# Patient Record
Sex: Female | Born: 1935 | Race: White | Hispanic: Yes | Smoking: Never smoker
Health system: Southern US, Community
[De-identification: ages and names within clinical notes are randomized; demographics above are authoritative.]

## PROBLEM LIST (undated history)

## (undated) DIAGNOSIS — M8580 Other specified disorders of bone density and structure, unspecified site: Secondary | ICD-10-CM

## (undated) DIAGNOSIS — E079 Disorder of thyroid, unspecified: Secondary | ICD-10-CM

## (undated) DIAGNOSIS — L821 Other seborrheic keratosis: Secondary | ICD-10-CM

## (undated) HISTORY — DX: Other specified disorders of bone density and structure, unspecified site: M85.80

## (undated) HISTORY — DX: Disorder of thyroid, unspecified: E07.9

## (undated) HISTORY — DX: Other seborrheic keratosis: L82.1

## (undated) HISTORY — PX: APPENDECTOMY: SHX54

---

## 2005-04-13 ENCOUNTER — Emergency Department (HOSPITAL_COMMUNITY): Admission: EM | Admit: 2005-04-13 | Discharge: 2005-04-13 | Payer: Self-pay | Admitting: Family Medicine

## 2005-08-14 ENCOUNTER — Other Ambulatory Visit: Admission: RE | Admit: 2005-08-14 | Discharge: 2005-08-14 | Payer: Self-pay | Admitting: Gynecology

## 2005-10-11 ENCOUNTER — Encounter: Admission: RE | Admit: 2005-10-11 | Discharge: 2005-10-11 | Payer: Self-pay | Admitting: Gynecology

## 2006-01-10 ENCOUNTER — Emergency Department (HOSPITAL_COMMUNITY): Admission: EM | Admit: 2006-01-10 | Discharge: 2006-01-11 | Payer: Self-pay | Admitting: Emergency Medicine

## 2006-07-26 ENCOUNTER — Emergency Department (HOSPITAL_COMMUNITY): Admission: EM | Admit: 2006-07-26 | Discharge: 2006-07-26 | Payer: Self-pay | Admitting: Emergency Medicine

## 2006-12-11 ENCOUNTER — Ambulatory Visit (HOSPITAL_COMMUNITY): Admission: RE | Admit: 2006-12-11 | Discharge: 2006-12-12 | Payer: Self-pay | Admitting: Ophthalmology

## 2007-02-01 ENCOUNTER — Encounter: Admission: RE | Admit: 2007-02-01 | Discharge: 2007-02-01 | Payer: Self-pay | Admitting: Geriatric Medicine

## 2007-04-03 ENCOUNTER — Other Ambulatory Visit: Admission: RE | Admit: 2007-04-03 | Discharge: 2007-04-03 | Payer: Self-pay | Admitting: Gynecology

## 2007-09-10 ENCOUNTER — Encounter: Admission: RE | Admit: 2007-09-10 | Discharge: 2007-09-10 | Payer: Self-pay | Admitting: Geriatric Medicine

## 2008-04-03 ENCOUNTER — Other Ambulatory Visit: Admission: RE | Admit: 2008-04-03 | Discharge: 2008-04-03 | Payer: Self-pay | Admitting: Gynecology

## 2008-11-19 ENCOUNTER — Encounter: Admission: RE | Admit: 2008-11-19 | Discharge: 2008-11-19 | Payer: Self-pay | Admitting: Geriatric Medicine

## 2009-05-13 ENCOUNTER — Encounter: Admission: RE | Admit: 2009-05-13 | Discharge: 2009-05-13 | Payer: Self-pay | Admitting: Orthopedic Surgery

## 2009-11-30 ENCOUNTER — Other Ambulatory Visit: Admission: RE | Admit: 2009-11-30 | Discharge: 2009-11-30 | Payer: Self-pay | Admitting: Cardiology

## 2009-11-30 ENCOUNTER — Ambulatory Visit: Payer: Self-pay | Admitting: Gynecology

## 2010-01-27 ENCOUNTER — Encounter: Admission: RE | Admit: 2010-01-27 | Discharge: 2010-01-27 | Payer: Self-pay | Admitting: Geriatric Medicine

## 2010-10-02 ENCOUNTER — Encounter: Payer: Self-pay | Admitting: Geriatric Medicine

## 2010-10-03 ENCOUNTER — Encounter: Payer: Self-pay | Admitting: Geriatric Medicine

## 2010-12-15 ENCOUNTER — Encounter: Payer: Self-pay | Admitting: Gynecology

## 2011-01-16 ENCOUNTER — Other Ambulatory Visit: Payer: Self-pay | Admitting: Gynecology

## 2011-01-16 ENCOUNTER — Other Ambulatory Visit (HOSPITAL_COMMUNITY)
Admission: RE | Admit: 2011-01-16 | Discharge: 2011-01-16 | Disposition: A | Discharge status: Home / Self Care | Payer: Medicare Other | Source: Ambulatory Visit | Attending: Gynecology | Admitting: Gynecology

## 2011-01-16 ENCOUNTER — Encounter (INDEPENDENT_AMBULATORY_CARE_PROVIDER_SITE_OTHER): Payer: Medicare Other | Admitting: Gynecology

## 2011-01-16 DIAGNOSIS — I10 Essential (primary) hypertension: Secondary | ICD-10-CM

## 2011-01-16 DIAGNOSIS — R634 Abnormal weight loss: Secondary | ICD-10-CM

## 2011-01-16 DIAGNOSIS — E039 Hypothyroidism, unspecified: Secondary | ICD-10-CM

## 2011-01-16 DIAGNOSIS — Z124 Encounter for screening for malignant neoplasm of cervix: Secondary | ICD-10-CM

## 2011-01-16 DIAGNOSIS — Z1231 Encounter for screening mammogram for malignant neoplasm of breast: Secondary | ICD-10-CM

## 2011-01-16 DIAGNOSIS — M899 Disorder of bone, unspecified: Secondary | ICD-10-CM

## 2011-01-16 DIAGNOSIS — E559 Vitamin D deficiency, unspecified: Secondary | ICD-10-CM

## 2011-01-16 DIAGNOSIS — Z1211 Encounter for screening for malignant neoplasm of colon: Secondary | ICD-10-CM

## 2011-01-27 NOTE — Op Note (Signed)
NAME:  Kaylee Levine, Kaylee Levine           ACCOUNT NO.:  [id number]   MEDICAL RECORD NO.:  [id number]          PATIENT TYPE:  OIB   LOCATION:  2550                         FACILITY:  [facility]   PHYSICIAN:  John D. Ashley Royalty, M.D. DATE OF BIRTH:  [birth year]   DATE OF PROCEDURE:  12/11/2006  DATE OF DISCHARGE:                               OPERATIVE REPORT   ADMISSION DIAGNOSIS:  Rhegmatogenous retinal detachment, left eye.   PROCEDURE:  Scleral buckle left eye, retinal photocoagulation left eye,  gas fluid exchange left eye.   SURGEON:  Beulah Gandy. Ashley Royalty, M.D.   ASSISTANT:  Bryan Lemma. Lundquist, P.A.-C.   ANESTHESIA:  General.   DETAILS:  Usual prep and drape, 360 degrees limbal peritomy, isolation  of four rectus muscles on 2-0 silk.  Scleral dissection for 360 degrees  to admit a number 279 intrascleral implant.  Diathermy placed in the  bed.  The 240 band placed around the eye with a 270 sleeve at 11  o'clock.  Perforation site chosen at 2 o'clock. A large amount of clear  colorless subretinal fluid came forth.  The scleral flaps were closed  with 4-0 Mersilene suture as the fluid egressed.  C3F8 in a 50%  concentration, 0.9 mL, was injected through the pars plana into the  vitreous cavity to reinflate the globe.  A 508G radial segment was  placed at 1 o'clock beneath the break. The scleral flaps were close and  the band was adjusted.  Indirect ophthalmoscopy showed the retina to be  lying nicely on the scleral buckle with the break well supported. The  indirect ophthalmoscope laser was moved into place. 1065 burns were  placed around the periphery on the scleral buckle with a power of 1200  milliwatts, 1000 microns each, and 0.1 seconds each.  The buckle was  adjusted and trimmed.  The band was adjusted and trimmed.  The sutures  were knotted and the free ends removed.  The conjunctiva was reposited  with 7-0 chromic suture.  Polymyxin and gentamicin were irrigated into  tenon's space.   Atropine solution was applied.  Marcaine was injected  around the globe for postop pain.  Closing pressure was 10 with a  Banker.  Complications were none.  Duration two hours.  The  patient was awakened and taken to recovery in satisfactory condition.      Beulah Gandy. Ashley Royalty, M.D.  Electronically Signed     JDM/MEDQ  D:  12/11/2006  T:  12/11/2006  Job:  [id number]

## 2011-01-30 ENCOUNTER — Ambulatory Visit (HOSPITAL_COMMUNITY)
Admission: RE | Admit: 2011-01-30 | Discharge: 2011-01-30 | Disposition: A | Discharge status: Home / Self Care | Payer: Medicare Other | Source: Ambulatory Visit | Attending: Gynecology | Admitting: Gynecology

## 2011-01-30 DIAGNOSIS — Z1231 Encounter for screening mammogram for malignant neoplasm of breast: Secondary | ICD-10-CM | POA: Insufficient documentation

## 2011-02-28 ENCOUNTER — Institutional Professional Consult (permissible substitution) (INDEPENDENT_AMBULATORY_CARE_PROVIDER_SITE_OTHER): Payer: Medicare Other | Admitting: Gynecology

## 2011-02-28 ENCOUNTER — Other Ambulatory Visit: Payer: Self-pay | Admitting: Gynecology

## 2011-02-28 DIAGNOSIS — N9089 Other specified noninflammatory disorders of vulva and perineum: Secondary | ICD-10-CM

## 2011-05-18 ENCOUNTER — Ambulatory Visit (INDEPENDENT_AMBULATORY_CARE_PROVIDER_SITE_OTHER): Payer: Medicare Other | Admitting: Gynecology

## 2011-05-18 ENCOUNTER — Other Ambulatory Visit: Payer: Self-pay

## 2011-05-18 DIAGNOSIS — M899 Disorder of bone, unspecified: Secondary | ICD-10-CM

## 2011-05-18 DIAGNOSIS — M949 Disorder of cartilage, unspecified: Secondary | ICD-10-CM

## 2011-05-19 ENCOUNTER — Telehealth: Payer: Self-pay | Admitting: Anesthesiology

## 2011-05-19 ENCOUNTER — Other Ambulatory Visit: Payer: Self-pay | Admitting: *Deleted

## 2011-05-19 DIAGNOSIS — M898X9 Other specified disorders of bone, unspecified site: Secondary | ICD-10-CM

## 2011-05-19 NOTE — Telephone Encounter (Signed)
I called patient about her Dexa Report. Advised per Dr. Lily Peer that she needs to come in for Lab work ,, she needs Calcium, vitamin d, PTH, BUN, creatinine, and to make an appointment within a week to come in and discuss the results.. Order is in pc per [place]. All was explained in Spanish advise to call me back with any additional questions she may have.

## 2011-05-23 ENCOUNTER — Other Ambulatory Visit: Payer: Medicare Other

## 2011-05-23 ENCOUNTER — Other Ambulatory Visit: Payer: Self-pay | Admitting: Gynecology

## 2011-05-24 LAB — PTH, INTACT AND CALCIUM: Calcium, Total (PTH): 9.8 mg/dL (ref 8.4–10.5)

## 2011-05-24 LAB — BUN: BUN: 16 mg/dL (ref 6–23)

## 2011-05-25 LAB — CREATININE, SERUM: Creat: 0.93 mg/dL (ref 0.50–1.10)

## 2011-06-01 ENCOUNTER — Institutional Professional Consult (permissible substitution): Payer: Medicare Other | Admitting: Gynecology

## 2011-06-08 ENCOUNTER — Institutional Professional Consult (permissible substitution): Payer: Medicare Other | Admitting: Gynecology

## 2011-06-22 ENCOUNTER — Institutional Professional Consult (permissible substitution): Payer: Medicare Other | Admitting: Gynecology

## 2011-07-15 NOTE — Progress Notes (Deleted)
Kaylee Levine [birth year] [id number]   History:    77 y.o.  for annual exam ***  Past medical history,surgical history, family history and social history were all reviewed and documented in the EPIC chart.  ROS:  Was performed and pertinent positives and negatives are included in the history.  Exam: chaperone present There were no vitals taken for this visit.  There is no height or weight on file to calculate BMI.  General appearance : Well developed well nourished female. No acute distress HEENT: Neck supple, trachea midline, no carotid bruits, no thyroidmegaly Lungs: Clear to auscultation, no rhonchi or wheezes, or rib retractions  Heart: Regular rate and rhythm, no murmurs or gallops Breast:Examined in sitting and supine position were symmetrical in appearance, no palpable masses or tenderness,  no skin retraction, no nipple inversion, no nipple discharge, no skin discoloration, no axillary or supraclavicular lymphadenopathy Abdomen: no palpable masses or tenderness, no rebound or guarding Extremities: no edema or skin discoloration or tenderness  Pelvic:  Bartholin, Urethra, Skene Glands: Within normal limits             Vagina: No gross lesions or discharge  Cervix: No gross lesions or discharge  Uterus  ***, normal size, shape and consistency, non-tender and mobile  Adnexa  Without masses or tenderness  Anus and perineum  normal   Rectovaginal  normal sphincter tone without palpated masses or tenderness             Hemoccult ***     Assessment/Plan:  77 y.o. female for annual exam Reynaldo Minium H MD, 11:44 AM 07/15/2011

## 2011-07-17 ENCOUNTER — Encounter: Payer: Self-pay | Admitting: Gynecology

## 2011-07-17 ENCOUNTER — Ambulatory Visit (INDEPENDENT_AMBULATORY_CARE_PROVIDER_SITE_OTHER): Payer: Medicare Other | Admitting: Gynecology

## 2011-07-17 VITALS — BP 130/74

## 2011-07-17 DIAGNOSIS — M898X9 Other specified disorders of bone, unspecified site: Secondary | ICD-10-CM

## 2011-07-17 DIAGNOSIS — M899 Disorder of bone, unspecified: Secondary | ICD-10-CM

## 2011-07-17 DIAGNOSIS — M949 Disorder of cartilage, unspecified: Secondary | ICD-10-CM

## 2011-07-17 DIAGNOSIS — M8580 Other specified disorders of bone density and structure, unspecified site: Secondary | ICD-10-CM | POA: Insufficient documentation

## 2011-07-17 DIAGNOSIS — M948X9 Other specified disorders of cartilage, unspecified sites: Secondary | ICD-10-CM

## 2011-07-17 DIAGNOSIS — E7800 Pure hypercholesterolemia, unspecified: Secondary | ICD-10-CM | POA: Insufficient documentation

## 2011-07-17 DIAGNOSIS — D649 Anemia, unspecified: Secondary | ICD-10-CM | POA: Insufficient documentation

## 2011-07-17 DIAGNOSIS — E039 Hypothyroidism, unspecified: Secondary | ICD-10-CM | POA: Insufficient documentation

## 2011-07-17 DIAGNOSIS — I10 Essential (primary) hypertension: Secondary | ICD-10-CM | POA: Insufficient documentation

## 2011-07-17 MED ORDER — ALENDRONATE SODIUM 70 MG PO TABS: 70.0000 mg | ORAL_TABLET | ORAL | Status: DC

## 2011-07-17 NOTE — Progress Notes (Signed)
Patient 77 year old gravida 2 para 2 who was seen in the office in 01/16/2011. Patient with known history of osteopenia, hypercholesterolemia, hypothyroidism, hypertension, and anemia. Her primary physician is Dr. Redmond School. Patient during that visit had a fecal occult blood testing which was negative as well as her TSH and Pap smear. Her mammogram is up-to-date was made 2012 which was normal. Patient with long-standing history of osteopenia. She also has had a history of colonic polyp removed which was benign more than 7 years ago. She has been reminded to schedule an appointment with her gastroenterologist.  Her bone density study done here in the office on September 6 had demonstrated she had a significant bone loss of more than 8.1% on her left hip. For this reason the calcium, vitamin D, PT H., BUN and creatinine had been done which was normal. Since she is 77 years of age and are T score lowest at the right femoral neck is now -2.2 regardless of her Frax index been suppression all we discussed about placing her on antiresorptive agents will place her on generic alendronate 70 mg q. weekly and repeat her bone density study in one year. The risks benefits and pros and cons were discussed with the patient to include osteonecrosis of the jaw and spontaneous subtrochanteric fracture.  She'll make an appointment to followup with her internist as well as of the gastroenterologist and we'll see her back next year. All the above was discussed in Spanish and we'll follow accordingly.

## 2011-12-25 ENCOUNTER — Encounter (INDEPENDENT_AMBULATORY_CARE_PROVIDER_SITE_OTHER): Payer: Medicare Other | Admitting: Ophthalmology

## 2011-12-28 ENCOUNTER — Encounter (INDEPENDENT_AMBULATORY_CARE_PROVIDER_SITE_OTHER): Payer: Medicare Other | Admitting: Ophthalmology

## 2011-12-28 DIAGNOSIS — H33009 Unspecified retinal detachment with retinal break, unspecified eye: Secondary | ICD-10-CM

## 2011-12-28 DIAGNOSIS — H43819 Vitreous degeneration, unspecified eye: Secondary | ICD-10-CM

## 2011-12-28 DIAGNOSIS — H3530 Unspecified macular degeneration: Secondary | ICD-10-CM

## 2012-01-10 ENCOUNTER — Other Ambulatory Visit (HOSPITAL_BASED_OUTPATIENT_CLINIC_OR_DEPARTMENT_OTHER): Payer: Self-pay | Admitting: Geriatric Medicine

## 2012-01-10 DIAGNOSIS — Z1231 Encounter for screening mammogram for malignant neoplasm of breast: Secondary | ICD-10-CM

## 2012-01-17 ENCOUNTER — Encounter: Payer: Medicare Other | Admitting: Gynecology

## 2012-02-02 ENCOUNTER — Encounter: Payer: Self-pay | Admitting: Gynecology

## 2012-02-02 ENCOUNTER — Ambulatory Visit (INDEPENDENT_AMBULATORY_CARE_PROVIDER_SITE_OTHER): Payer: Medicare Other | Admitting: Gynecology

## 2012-02-02 VITALS — BP 140/84 | Ht 60.25 in | Wt 136.0 lb

## 2012-02-02 DIAGNOSIS — M8580 Other specified disorders of bone density and structure, unspecified site: Secondary | ICD-10-CM

## 2012-02-02 DIAGNOSIS — M949 Disorder of cartilage, unspecified: Secondary | ICD-10-CM

## 2012-02-02 DIAGNOSIS — E119 Type 2 diabetes mellitus without complications: Secondary | ICD-10-CM

## 2012-02-02 DIAGNOSIS — N952 Postmenopausal atrophic vaginitis: Secondary | ICD-10-CM

## 2012-02-02 NOTE — Patient Instructions (Signed)
Mantenimiento de la salud en las mujeres (Health Maintenance, Females) Un estilo de vida saludable y los cuidados preventivos pueden favorecer la salud y el bienestar.   Haga exmenes regulares de la salud en general, dentales y de los ojos.   Consuma una dieta saludable. Los alimentos como vegetales, frutas, granos enteros, productos lcteos descremados y protenas magras contienen los nutrientes que usted necesita sin necesidad de consumir muchas caloras. Disminuya el consumo de alimentos con alto contenido de grasas slidas, azcar y sal agregadas. Si es necesario, pdaleinformacin acerca de una dieta adecuada a su mdico.   La actividad fsica regular es una de las cosas ms importantes que puede hacer por su salud. Los adultos deben hacer al menos 150 minutos de ejercicios de intensidad moderada (cualquier actividad que aumente la frecuencia cardaca y lo haga transpirar) cada semana. Adems, la mayora de los adultos necesita ejercicios de fortalecimiento muscular 2  ms das por semana.    Mantenga un peso saludable. El ndice de masa corporal (IMC) es una herramienta que identifica posibles problemas con el peso. Proporciona una estimacin de la grasa corporal basndose en el peso y la altura. El mdico podr determinar su IMC y podr ayudarlo a lograr o mantener un peso saludable. Para los adultos de 20 aos o ms:   Un IMC menor a 18,5 se considera bajo peso.   Un IMC entre 18,5 y 24,9 es normal.   Un IMC entre 25 y 29,9 es sobrepeso.   Un IMC entre 30 o ms es obesidad.   Mantenga un nivel normal de lpidos y colesterol en sangre practicando actividad fsica y minimizando la ingesta de grasas saturadas. Consuma una dieta balanceada e incluya variedad de frutas y vegetales. Los anlisis de lpidos y colesterol en sangre deben comenzar a los 20 aos y repetirse cada 5 aos. Si los niveles de colesterol son altos, tiene ms de 50 aos o tiene riesgo elevado de sufrir enfermedades  cardacas, necesitar controlarse con ms frecuencia.Si tiene niveles elevados de lpidos y colesterol, debe recibir tratamiento con medicamentos, si la dieta y el ejercicio no son efectivos.   Si fuma, consulte con el profesional acerca de las opciones para dejar de hacerlo. Si no lo hace, no comience.   Si est embarazada no beba alcohol. Si est amamantando, beba alcohol con prudencia. Si elige beber alcohol, no se exceda de 1 medida por da. Se considera una medida a 12 onzas (355 ml) de cerveza, 5 onzas (148 ml) de vino, o 1,5 onzas (44 ml) de licor.   Evite el alcohol y el consumo de drogas. No comparta agujas. Pida ayuda si necesita asistencia o instrucciones con respecto a abandonar el consumo de alcohol, cigarrillos o drogas.   La hipertensin arterial causa enfermedades cardacas y aumenta el riesgo de ictus. Debe controlar su presin arterial al menos cada 1 o 2 aos. La presin arterial elevada que persiste debe tratarse con medicamentos si la prdida de peso y el ejercicio no son efectivos.   Si tiene entre 55 y 79 aos, consulte a su mdico si debe tomar aspirina para prevenir enfermedades cardacas.   Los anlisis para la diabetes incluyen la toma de una muestra de sangre para controlar el nivel de azcar en la sangre durante el ayuno. Debe hacerlo cada 3 aos despus de los 45 aos si est dentro de su peso normal y sin factores de riesgo para la diabetes. Las pruebas deben comenzar a edades tempranas o llevarse a cabo con ms frecuencia   si tiene sobrepeso y al menos 1 factor de riesgo para la diabetes.   Las evaluaciones para detectar el cncer de mama son un mtodo preventivo fundamental para las mujeres. Debe practicar la "autoconciencia de las mamas". Esto significa que debe reconocer la apariencia normal de sus mamas y como las siente y pudiendo incluir un autoexamen de mamas. Si detecta algn cambio, no importa cun pequeo sea, debe informarlo a su mdico. Las mujeres entre 20 y  40 aos deben hacer un examen clnico de las mamas como parte del examen regular de salud, cada 1 a 3 aos. Despus de los 40 aos deben hacerlo todos los aos. Deben hacerse una mamografa radografa de mamas ) cada ao, comenzando a los 40 aos. Las mujeres con historia familiar de cncer de mama deben hablar con el mdico para hacer un estudio gentico. Las que tienen ms riesgo deben hacerse resonancia magntica y una mamografa todos los aos.   Un test de Pap se realiza para diagnosticar cncer de cuello de tero. Las mujeres deben hacerse un test de Pap a partir de los 21 aos. Entre los 21 y los 29 aos debe repetirse cada dos aos. Luego de los 30 aos, debe realizarse un test de Pap cada tres aos siempre que los 3 estudios anteriores sean normales. Si le han realizado una histerectoma por un problema que no era cncer u otra enfermedad que podra causar cncer, ya no necesitar un test de Pap. Si tiene entre 65 y 70 aos y ha tenido un test de Pap normal en los ltimos 10 aos, ya no ser necesario realizarlo. Si ha recibido un tratamiento para el cncer cervical o para una enfermedad que podra causar cncer, necesitar realizar un test de Pap y controles durante al menos 20 aos de concluir el tratamiento. Si no se ha hecho el examen con regularidad, debern volver a evaluarse los factores de riesgo (como el tener un nuevo compaero sexual) para determinar si debe volver a realizarse los estudios. Algunas mujeres sufren problemas mdicos que aumentan la probabilidad de contraer cncer cervical. En estos casos, el mdico podr indicar que se realice el test de Pap con ms frecuencia.   La prueba del virus del papiloma humano (VPH) es un anlisis adicional que puede usarse para detectar cncer de cuello de tero. Esta prueba busca la presencia del virus que causa los cambios en el cuello. Las clulas que se recolectan durante el test de Pap pueden usarse para el VPH. La prueba para el VPH puede  usarse para evaluar a mujeres de ms de 30 aos y debe usarse en mujeres de cualquier edad cuyos resultados del test de Pap no sean claros. Despus de los 30 aos, las mujeres deben hacerse el anlisis para el VPH con la misma frecuencia que el test de Pap.   El cncer colorectal puede detectarse y con frecuencia puede prevenirse. La mayor parte de los estudios de rutina comienzan a los 50 aos y continan hasta los 75 aos. Sin embargo, el mdico podr aconsejarle que lo haga antes, si tiene factores de riesgo para el cncer de colon. Una vez por ao, el profesional le dar un kit de prueba para hallar sangre oculta en la materia fecal. La utilizacin de un tubo con una pequea cmara en su extremo para examinar directamente el colon (sigmoidoscopa o colonoscopa), puede detectar formas temprana de cncer colorectal. Hable con su mdico si tiene 50 aos, cuando comience con los estudios de rutina. El examen directo del   colon debe repetirse cada 5 a 10 aos, hasta los 75 aos, excepto que se encuentren formas tempranas de plipos precancerosos o pequeos bultos.   Se recomienda realizar un anlisis de sangre para detectar hepatitis C a todas las personas nacidas entre 1945 y 1965, y a todo aquel que tenga un riesgo conocido de haber contrado esta enfermedad.   Practique el sexo seguro. Use condones y evite las prcticas sexuales riesgosas para disminuir el contagio de enfermedades de transmisin sexual. Las mujeres sexualmente activas de 25 aos o menos deben controlarse para descartar clamidia, que es una infeccin de transmisin sexual frecuente. Las mujeres mayores que tengan mltiples compaeros tambin deben hacerse el anlisis para detectar clamidia. Se recomienda realizar anlisis para detectar otras enfermedades de transmisin sexual si es sexualmente activa y tiene riesgos.   La osteoporosis es una enfermedad en la que los huesos pierden los minerales y la fuerza por el avance de la edad. El  resultado pueden ser fracturas graves en los huesos. El riesgo de osteoporosis puede identificarse con una prueba de densidad sea. Las mujeres de ms de 65 aos y las que tengan riesgos de sufrir fracturas u osteoporosis deben pedir consejo a su mdico. Consulte a su mdico si debe tomar un suplemento de calcio o de vitamina D para reducir el riesgo de osteoporosis.   La menopausia se asocia a sntomas y riesgos fsicos. Se dispone de una terapia de reemplazo hormonal para disminuir los sntomas y los riesgos. Consulte a su mdico para saber si la terapia de reemplazo hormonal es conveniente para usted.   Use una pantalla solar con un factor SPF de 30 o mayor. Aplique pantalla de manera libre y repetida a lo largo del da. Pngase al resguardo del sol cuando la sombra sea ms pequea que usted. Protjase usando mangas y pantalones largos, un sombrero de ala ancha y gafas para el sol todo el ao, siempre que se encuentre en el exterior.   Informe a su mdico si aparecen nuevos lunares o los que tiene se modifican, especialmente en forma y color. Tambin notifique al mdico si un lunar es ms grande que el tamao de una goma de lpiz.   Mantngase al da con las vacunas.  Document Released: 08/17/2011 [employer] Patient Information 2012 [employer]. 

## 2012-02-02 NOTE — Progress Notes (Signed)
Kaylee Levine Jan 30, 1934 [id number]   History:    78 y.o.  with history of osteopenia tenia based on bone density study done in 2012. Patient's Frax analysis indicating that her 10 year fracture risk exceeded but cutoff threshold. She was started on Fosamax 70 mg q. weekly. She is tolerating it well. She is taking her calcium and vitamin D twice a day as well. Patient's colonoscopy was 8 years ago. Her last mammogram was in 2012 and she has one scheduled for a few weeks from now. Patient has a history of colon type 2 diabetes, hypertension, hypercholesterolemia, and hypothyroidism. She has not seen her primary physician and close to a year and would like to switch. She's not fasting today so no lab work will be drawn today.  Past medical history,surgical history, family history and social history were all reviewed and documented in the EPIC chart.  Gynecologic History No LMP recorded. Patient is postmenopausal. Contraception: none Last Pap: 2012. Results were: normal Last mammogram: 2012. Results were: normal  Obstetric History OB History    Grav Para Term Preterm Abortions TAB SAB Ect Mult Living   2 2 2       2      # Outc Date GA Lbr Len/2nd Wgt Sex Del Anes PTL Lv   1 TRM     M SVD  No Yes   2 TRM     F CS  No Yes       ROS: A ROS was performed and pertinent positives and negatives are included in the history.  GENERAL: No fevers or chills. HEENT: No change in vision, no earache, sore throat or sinus congestion. NECK: No pain or stiffness. CARDIOVASCULAR: No chest pain or pressure. No palpitations. PULMONARY: No shortness of breath, cough or wheeze. GASTROINTESTINAL: No abdominal pain, nausea, vomiting or diarrhea, melena or bright red blood per rectum. GENITOURINARY: No urinary frequency, urgency, hesitancy or dysuria. MUSCULOSKELETAL: Patient has joint pains in the mornings especially her knees, no back pain, no recent trauma. DERMATOLOGIC: No rash, no itching, no lesions.  ENDOCRINE: No polyuria, polydipsia, no heat or cold intolerance. No recent change in weight. HEMATOLOGICAL: No anemia or easy bruising or bleeding. NEUROLOGIC: No headache, seizures, numbness, tingling or weakness. PSYCHIATRIC: No depression, no loss of interest in normal activity or change in sleep pattern.     Exam: chaperone present  BP 140/84  Ht 5' 0.25" (1.53 m)  Wt 136 lb (61.689 kg)  BMI 26.34 kg/m2  Body mass index is 26.34 kg/(m^2).  General appearance : Well developed well nourished female. No acute distress HEENT: Neck supple, trachea midline, no carotid bruits, no thyroidmegaly Lungs: Clear to auscultation, no rhonchi or wheezes, or rib retractions  Heart: Regular rate and rhythm, no murmurs or gallops Breast:Examined in sitting and supine position were symmetrical in appearance, no palpable masses or tenderness,  no skin retraction, no nipple inversion, no nipple discharge, no skin discoloration, no axillary or supraclavicular lymphadenopathy Abdomen: no palpable masses or tenderness, no rebound or guarding Extremities: no edema or skin discoloration or tenderness  Pelvic:  Bartholin, Urethra, Skene Glands: Within normal limits             Vagina: No gross lesions or discharge  Cervix: No gross lesions or discharge  Uterus  axial, normal size, shape and consistency, non-tender and mobile  Adnexa  Without masses or tenderness  Anus and perineum  normal   Rectovaginal  normal sphincter tone without palpated masses or tenderness  Hemoccult cards provided     Assessment/Plan:  78 y.o. female for annual exam will be referred to one of my internal medicine colleagues to monitor her hypertension, hypothyroidism, hypercholesterolemia and type 2 diabetes. She will need a bone density study here in the office next year. New screening guidelines for Pap smear discussed she will no longer be a Pap smear. She has had normal Pap smears in the past. She was reminded again  to followup with her gastroenterologist. She is to send to the office the Hemoccult cards for testing. She was also reminded to followup with her mammogram and to continue to do her monthly self breast examination. She is to continue to take her calcium and vitamin D daily and her Fosamax weekly. We will take a drug-free holiday after 5-6 years of being on the alendronate. The alendronate (Fosamax) was started in 2012.    Ok Edwards MD, 11:24 AM 02/02/2012

## 2012-02-14 ENCOUNTER — Inpatient Hospital Stay (HOSPITAL_BASED_OUTPATIENT_CLINIC_OR_DEPARTMENT_OTHER): Admission: RE | Admit: 2012-02-14 | Payer: Medicare Other | Source: Ambulatory Visit

## 2012-02-19 ENCOUNTER — Other Ambulatory Visit: Payer: Self-pay | Admitting: *Deleted

## 2012-02-19 DIAGNOSIS — Z1211 Encounter for screening for malignant neoplasm of colon: Secondary | ICD-10-CM

## 2012-02-20 ENCOUNTER — Other Ambulatory Visit: Payer: Self-pay | Admitting: Gynecology

## 2012-02-20 DIAGNOSIS — Z1211 Encounter for screening for malignant neoplasm of colon: Secondary | ICD-10-CM

## 2012-02-21 ENCOUNTER — Other Ambulatory Visit: Payer: Self-pay | Admitting: Gynecology

## 2012-02-21 DIAGNOSIS — Z1231 Encounter for screening mammogram for malignant neoplasm of breast: Secondary | ICD-10-CM

## 2012-02-29 ENCOUNTER — Ambulatory Visit (HOSPITAL_BASED_OUTPATIENT_CLINIC_OR_DEPARTMENT_OTHER): Payer: Medicare Other

## 2012-03-08 ENCOUNTER — Ambulatory Visit (INDEPENDENT_AMBULATORY_CARE_PROVIDER_SITE_OTHER): Payer: Medicare Other | Admitting: Internal Medicine

## 2012-03-08 ENCOUNTER — Encounter: Payer: Self-pay | Admitting: Internal Medicine

## 2012-03-08 VITALS — BP 116/68 | HR 72 | Temp 98.4°F | Resp 14 | Ht 61.5 in | Wt 136.0 lb

## 2012-03-08 DIAGNOSIS — E039 Hypothyroidism, unspecified: Secondary | ICD-10-CM

## 2012-03-08 DIAGNOSIS — E7800 Pure hypercholesterolemia, unspecified: Secondary | ICD-10-CM

## 2012-03-08 DIAGNOSIS — I10 Essential (primary) hypertension: Secondary | ICD-10-CM

## 2012-03-08 DIAGNOSIS — E119 Type 2 diabetes mellitus without complications: Secondary | ICD-10-CM

## 2012-03-08 DIAGNOSIS — M1990 Unspecified osteoarthritis, unspecified site: Secondary | ICD-10-CM | POA: Insufficient documentation

## 2012-03-08 LAB — HEMOGLOBIN A1C: Hgb A1c MFr Bld: 6.1 % — ABNORMAL HIGH (ref <5.7)

## 2012-03-08 MED ORDER — HYDROCODONE-ACETAMINOPHEN 5-500 MG PO TABS: 1.0000 | ORAL_TABLET | Freq: Two times a day (BID) | ORAL | Status: AC | PRN

## 2012-03-08 NOTE — Assessment & Plan Note (Signed)
Reports a long history of DJD, has back pain on and off with sometimes radiation to the right leg. In the past she was told that she may need back surgery. She takes Vicodin as needed. Refill Vicodin today.

## 2012-03-08 NOTE — Assessment & Plan Note (Signed)
Is well-controlled, will check a BMP

## 2012-03-08 NOTE — Patient Instructions (Addendum)
Ask patient to   sign a release of information,  fax it to her previous primary doctor Dr.Tripp  in [place]. Request old records -------- Next visit in 3 months ------- FIRME EL DOCUMENTO PARA QUE PODAMOS OBTENER LOS RECORDS DEL DR. Redmond School REGRESE EN 3 MESES

## 2012-03-08 NOTE — Assessment & Plan Note (Signed)
On simvastatin, she does have pain but in the past it has been attributed to DJD

## 2012-03-08 NOTE — Assessment & Plan Note (Signed)
Reports good medication compliance, we'll check a TSH

## 2012-03-08 NOTE — Assessment & Plan Note (Signed)
Personal history of diabetes, on metformin, will check a hemoglobin A1c.

## 2012-03-08 NOTE — Progress Notes (Signed)
  Subjective:    Patient ID: Kaylee Levine, female    DOB: [birth year], 78 y.o.   MRN: [id number]  HPI New patient, transferring from r. Tripp in [place] Long history of diabetes, hypertension, hypothyroidism. Good medication compliance. Also has a history of DJD, takes Vicodin usually one time a day,  rarely requires 2 times a day. Ambulatory blood sugars around  90 to 113 No ambulatory blood pressures  Past medical history Diabetes Hypertension Hyperlipidemia, Thyroid disease DJD Osteoporosis, per gyn Gyn-- Dr Lily Peer  Past surgical history Appendectomy Bladder surgery  Social history Born in [place] to [place] in the 60s, moved to [place] ~ 2010 2 children, 1 son still in [place] lost a daughter Lives by herself, 2 Gkids live in [place] Tobacco-- never ETOH-- no   Family history Diabetes-- M CAD-- M Stroke-- no Colon cancer-- no Breast cancer-- no   Review of Systems No chest pain or shortness of breath No nausea, vomiting, diarrhea. No lower extremity edema.     Objective:   Physical Exam  General -- alert, well-developed, and well-nourished.   Neck --no thyromegaly , normal carotid pulse Lungs -- normal respiratory effort, no intercostal retractions, no accessory muscle use, and normal breath sounds.   Heart-- normal rate, regular rhythm, no murmur, and no gallop.   Extremities-- trace pretibial edema bilaterally Neurologic-- alert & oriented X3 and strength normal in all extremities. Psych-- Cognition and judgment appear intact. Alert and cooperative with normal attention span and concentration.  not anxious appearing and not depressed appearing.        Assessment & Plan:  New patient, used to see Dr. Redmond School, in [place]. Phone number [phone] Will request all records

## 2012-03-09 LAB — LIPID PANEL
Cholesterol: 182 mg/dL (ref 0–200)
Triglycerides: 104 mg/dL (ref <150)
VLDL: 21 mg/dL (ref 0–40)

## 2012-03-09 LAB — BASIC METABOLIC PANEL
BUN: 14 mg/dL (ref 6–23)
CO2: 27 mEq/L (ref 19–32)
Calcium: 9.1 mg/dL (ref 8.4–10.5)
Creat: 0.96 mg/dL (ref 0.50–1.10)
Glucose, Bld: 80 mg/dL (ref 70–99)

## 2012-03-09 LAB — AST: AST: 14 U/L (ref 0–37)

## 2012-03-13 ENCOUNTER — Encounter: Payer: Self-pay | Admitting: *Deleted

## 2012-03-15 ENCOUNTER — Ambulatory Visit (HOSPITAL_COMMUNITY): Payer: Medicare Other | Attending: Gynecology

## 2012-04-11 ENCOUNTER — Ambulatory Visit (HOSPITAL_COMMUNITY)
Admission: RE | Admit: 2012-04-11 | Discharge: 2012-04-11 | Disposition: A | Discharge status: Home / Self Care | Payer: Medicare Other | Source: Ambulatory Visit | Attending: Gynecology | Admitting: Gynecology

## 2012-04-11 DIAGNOSIS — Z1231 Encounter for screening mammogram for malignant neoplasm of breast: Secondary | ICD-10-CM | POA: Insufficient documentation

## 2012-04-16 ENCOUNTER — Telehealth: Payer: Self-pay | Admitting: Internal Medicine

## 2012-04-16 MED ORDER — SIMVASTATIN 40 MG PO TABS: 40.0000 mg | ORAL_TABLET | Freq: Every day | ORAL | Status: DC

## 2012-04-16 NOTE — Telephone Encounter (Signed)
Refill: Simvastatin 40 mg tablet. Take 1 tablet by mouth at bedtime. Qty 30. Last fill 03-11-12

## 2012-04-16 NOTE — Telephone Encounter (Signed)
Refill done.  

## 2012-04-17 ENCOUNTER — Other Ambulatory Visit: Payer: Self-pay | Admitting: *Deleted

## 2012-04-17 DIAGNOSIS — N63 Unspecified lump in unspecified breast: Secondary | ICD-10-CM

## 2012-04-22 ENCOUNTER — Telehealth: Payer: Self-pay | Admitting: *Deleted

## 2012-04-22 NOTE — Telephone Encounter (Signed)
Patient scheduled for follow up imaging for 04/25/12 at [facility] [place].

## 2012-04-22 NOTE — Telephone Encounter (Signed)
Message copied by Libby Maw on Mon Apr 22, 2012  8:55 AM ------      Message from: Ok Edwards      Created: Sun Apr 14, 2012 10:38 PM       Deano Tomaszewski, please check that this patients additional imaging studies of the breast have been ordered. Thanks JF

## 2012-04-25 ENCOUNTER — Ambulatory Visit
Admission: RE | Admit: 2012-04-25 | Discharge: 2012-04-25 | Disposition: A | Discharge status: Home / Self Care | Payer: Medicare Other | Source: Ambulatory Visit | Attending: Gynecology | Admitting: Gynecology

## 2012-04-25 DIAGNOSIS — N63 Unspecified lump in unspecified breast: Secondary | ICD-10-CM

## 2012-04-25 IMAGING — MG MM DIGITAL DIAGNOSTIC LIMITED*L*
2 series · 2 of 2 positions shown · non-contrast
Comparison: [DATE], [DATE], [DATE]

CLINICAL DATA: The patient returns for evaluation of a possible
mass in the left breast noted on recent screening study dated
[DATE].

[REDACTED] MAMMOGRAM

[L CC]
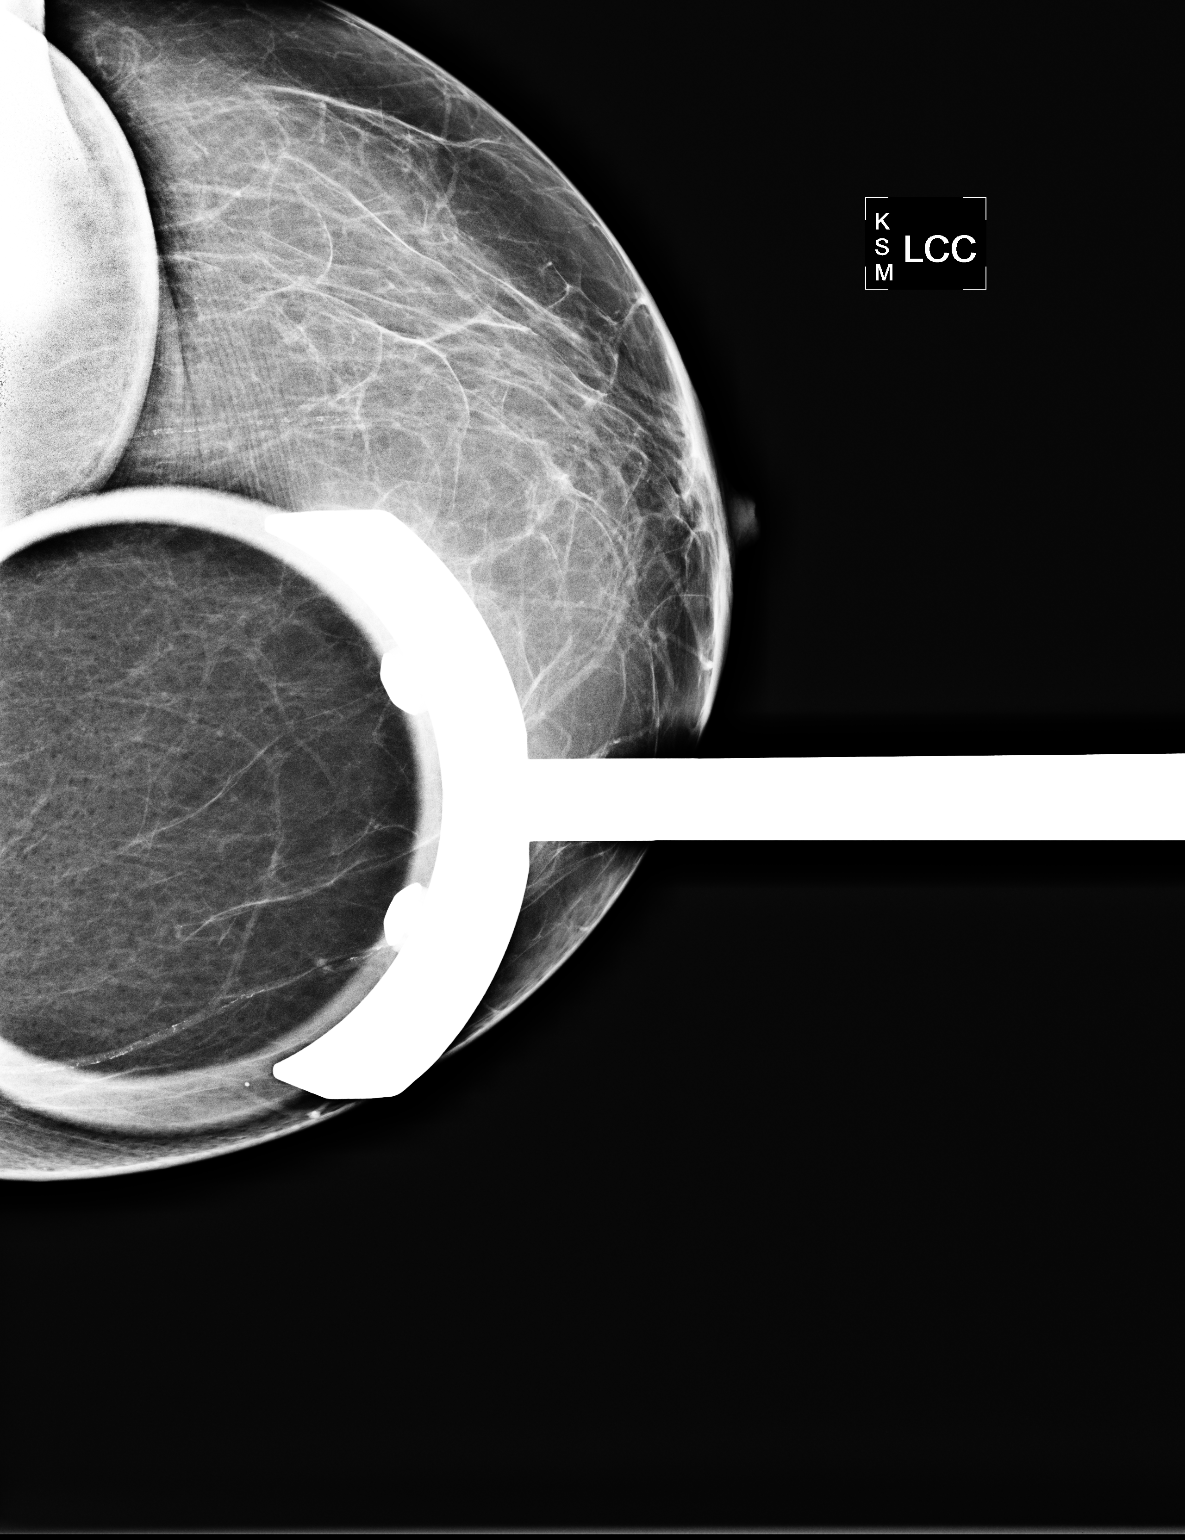

[L MLO]
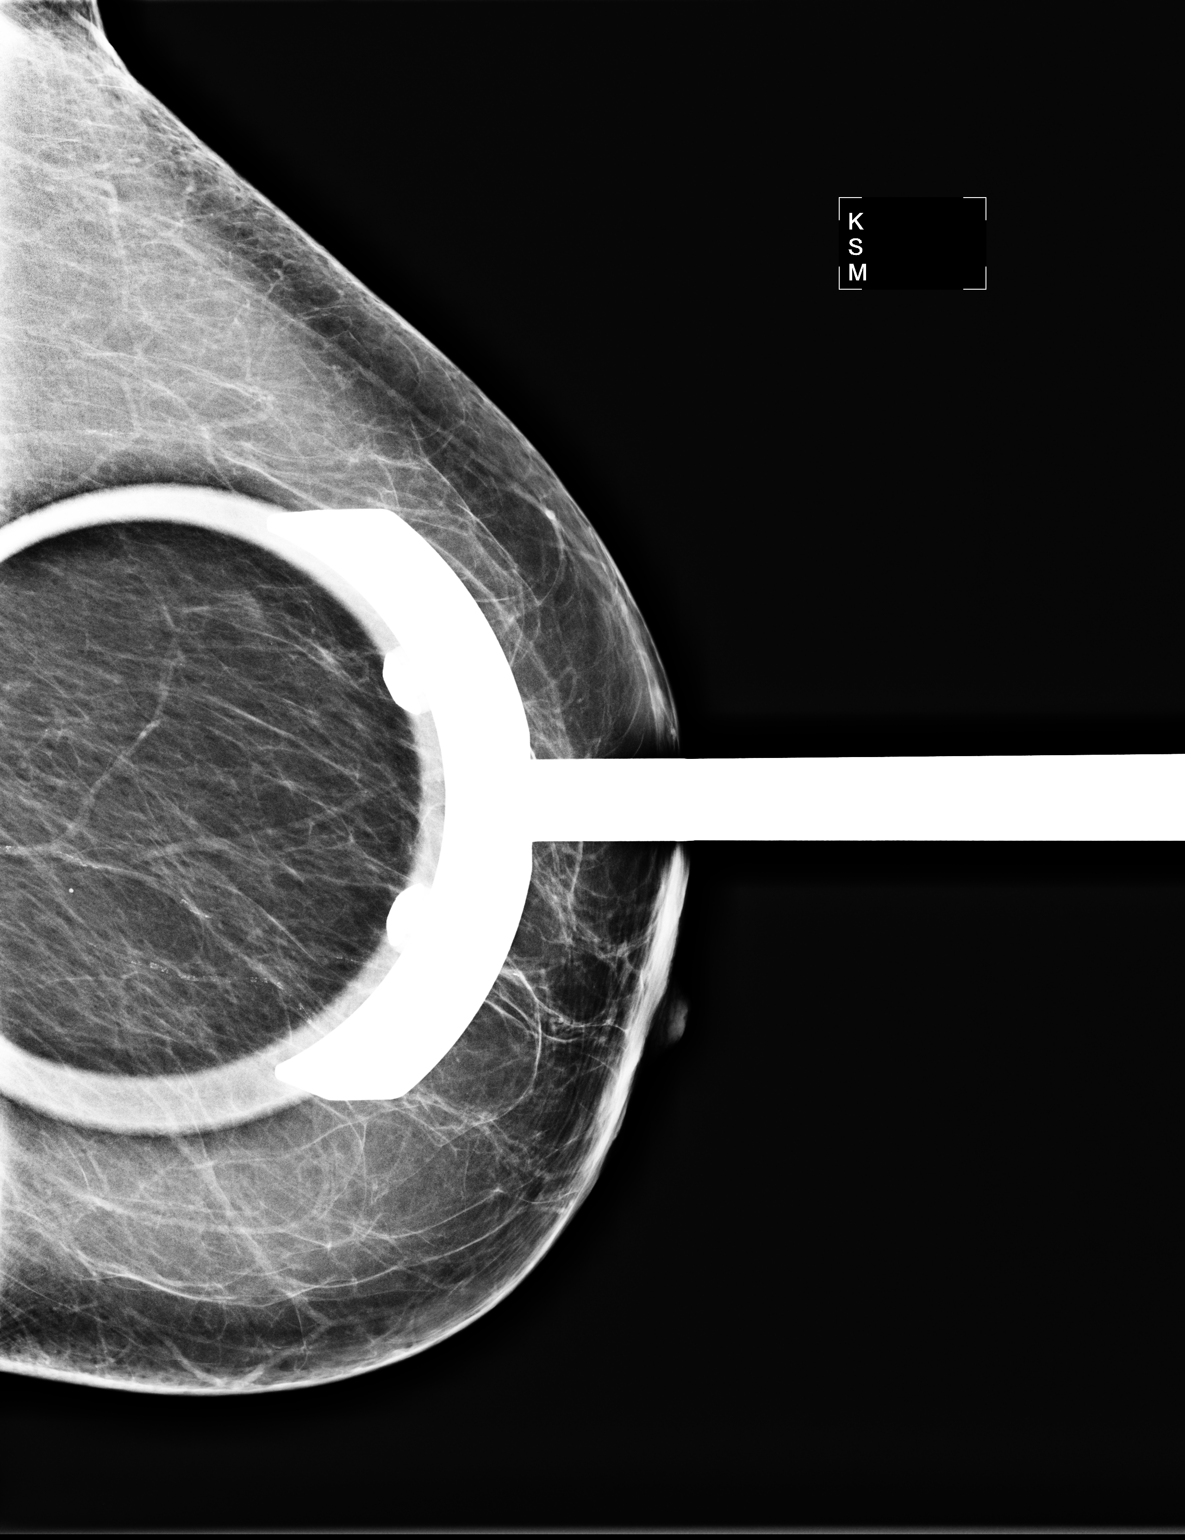

[2 of 2 positions shown; findings below may reference images not displayed]

FINDINGS: Additional views demonstrate no persistent mass or
distortion in the left upper inner quadrant.
IMPRESSION: No persistent worrisome abnormality upon additional imaging of the
left breast.

RECOMMENDATION:
Yearly screening mammography is suggested.

BI-RADS CATEGORY 1:  Negative.

## 2012-05-15 ENCOUNTER — Other Ambulatory Visit: Payer: Self-pay | Admitting: Internal Medicine

## 2012-05-15 NOTE — Telephone Encounter (Signed)
Ok to refill 

## 2012-05-15 NOTE — Telephone Encounter (Signed)
Done

## 2012-05-24 ENCOUNTER — Telehealth: Payer: Self-pay | Admitting: Internal Medicine

## 2012-05-24 MED ORDER — METFORMIN HCL 500 MG PO TABS: 500.0000 mg | ORAL_TABLET | Freq: Every day | ORAL | Status: DC

## 2012-05-24 NOTE — Telephone Encounter (Signed)
Pt needs new rx from Dr. Drue Novel on Metformin. She uses [address]

## 2012-05-24 NOTE — Telephone Encounter (Signed)
Refill done.  

## 2012-05-31 ENCOUNTER — Other Ambulatory Visit: Payer: Self-pay | Admitting: Internal Medicine

## 2012-05-31 NOTE — Telephone Encounter (Signed)
refill lancet, strips & Meter Per call from neighbor send to [facility] on [address] Pt speaks no english if you need to call her back may need to ask paz to help or use interpreter line  Cb# for patient [phone]

## 2012-06-03 MED ORDER — ONETOUCH ULTRA SYSTEM W/DEVICE KIT: 1.0000 | PACK | Freq: Once | Status: AC

## 2012-06-03 NOTE — Telephone Encounter (Signed)
Rx sent for one touch

## 2012-06-03 NOTE — Telephone Encounter (Signed)
Ok send RF to pharmacy

## 2012-06-07 MED ORDER — GLUCOSE BLOOD VI STRP: ORAL_STRIP | Status: AC

## 2012-06-07 NOTE — Telephone Encounter (Signed)
Caller (?) states the pts medication is not at the pharmacy. She needs her test strips. Please call test strips into [facility] on [address].

## 2012-06-07 NOTE — Telephone Encounter (Signed)
Sent in test strips to pharmacy 

## 2012-08-02 ENCOUNTER — Ambulatory Visit (INDEPENDENT_AMBULATORY_CARE_PROVIDER_SITE_OTHER): Payer: Medicare Other | Admitting: Internal Medicine

## 2012-08-02 VITALS — BP 144/74 | HR 74 | Temp 97.8°F | Wt 139.0 lb

## 2012-08-02 DIAGNOSIS — E119 Type 2 diabetes mellitus without complications: Secondary | ICD-10-CM

## 2012-08-02 DIAGNOSIS — M25519 Pain in unspecified shoulder: Secondary | ICD-10-CM

## 2012-08-02 MED ORDER — MELOXICAM 7.5 MG PO TABS: 7.5000 mg | ORAL_TABLET | Freq: Every day | ORAL | Status: DC

## 2012-08-02 NOTE — Progress Notes (Signed)
  Subjective:    Patient ID: Kaylee Levine, female    DOB: [birth year], 78 y.o.   MRN: [id number]  HPI Acute visit 2 weeks history of right shoulder pain, worse with arm elevation. She has chronic on and off neck pain, Vicodin usually helps the neck pain but has not help the shoulder pain.  Past medical history Diabetes Hypertension Hyperlipidemia, Thyroid disease DJD Osteoporosis, per gyn Gyn-- Dr Lily Peer  Past surgical history Appendectomy Bladder surgery  Social history Born in [place] to [place] in the 60s, moved to [place] ~ 2010 2 children, 1 son still in [place] lost a daughter Lives by herself, 2 Gkids live in [place]-- never ETOH-- no   Family history Diabetes-- M CAD-- M Stroke-- no Colon cancer-- no Breast cancer-- no   Review of Systems Denies any recent injury. Good compliance with medication Already had a flu shot Currently without neck pain    Objective:   Physical Exam  Musculoskeletal:       Arms:  General -- alert, well-developed, and well-nourished.   Neck --range of motion, not tender to palpation.  Extremities-- no pretibial edema bilaterally   Neurologic-- alert & oriented X3 , gait normal, strength normal in all extremities. Psych-- Cognition and judgment appear intact. Alert and cooperative with normal attention span and concentration.  not anxious appearing and not depressed appearing.       Assessment & Plan:  needs a routine checkup, patient aware

## 2012-08-02 NOTE — Assessment & Plan Note (Signed)
Needs a prescription for glucometer strips, done

## 2012-08-02 NOTE — Patient Instructions (Addendum)
Tome meloxicam 7.5 mg 1 pastilla al dia solo si le duele el hombro, tomela con Engineer, production. si le afecta el estomago (nausea, dolor de estomago, cambio en el color de las heces): deje de tomarla regrese en los proximos 2 meses para un chequeo

## 2012-08-02 NOTE — Assessment & Plan Note (Addendum)
Shoulder pain, not responding to Vicodin.  Although pain is going on for 2 weeks the range of motion is decreased, frozen shoulder? Plan: Mobic 7.5 daily for 2 weeks GI side effects discussed Refer to sports medicine ? Patient preferred to wait and see if she improves.

## 2012-08-14 ENCOUNTER — Other Ambulatory Visit: Payer: Self-pay | Admitting: Internal Medicine

## 2012-08-16 NOTE — Telephone Encounter (Signed)
Refill done.  

## 2012-08-16 NOTE — Telephone Encounter (Signed)
i do not see on pt's current med list. OK to refill?

## 2012-08-16 NOTE — Telephone Encounter (Signed)
She was supposed to be, okay to fill a three-month supply

## 2012-09-12 ENCOUNTER — Other Ambulatory Visit: Payer: Self-pay | Admitting: Internal Medicine

## 2012-09-12 NOTE — Telephone Encounter (Signed)
Spoke to nick at pharmacy & he stated pt picked this med up on 12.4.13. Pt does not need refill at this time.

## 2012-10-02 ENCOUNTER — Ambulatory Visit: Payer: Medicare Other | Admitting: Internal Medicine

## 2012-10-04 ENCOUNTER — Ambulatory Visit: Payer: Medicare Other | Admitting: Internal Medicine

## 2012-10-12 ENCOUNTER — Other Ambulatory Visit: Payer: Self-pay | Admitting: Internal Medicine

## 2012-10-14 NOTE — Telephone Encounter (Signed)
Refill done.  

## 2012-10-24 ENCOUNTER — Ambulatory Visit: Payer: Medicare Other | Admitting: Internal Medicine

## 2012-10-29 ENCOUNTER — Ambulatory Visit (INDEPENDENT_AMBULATORY_CARE_PROVIDER_SITE_OTHER): Payer: 59 | Admitting: Internal Medicine

## 2012-10-29 ENCOUNTER — Encounter: Payer: Self-pay | Admitting: Internal Medicine

## 2012-10-29 VITALS — BP 134/76 | HR 80 | Temp 97.9°F | Wt 139.0 lb

## 2012-10-29 DIAGNOSIS — D649 Anemia, unspecified: Secondary | ICD-10-CM

## 2012-10-29 DIAGNOSIS — M25519 Pain in unspecified shoulder: Secondary | ICD-10-CM

## 2012-10-29 DIAGNOSIS — I10 Essential (primary) hypertension: Secondary | ICD-10-CM

## 2012-10-29 DIAGNOSIS — M1990 Unspecified osteoarthritis, unspecified site: Secondary | ICD-10-CM

## 2012-10-29 DIAGNOSIS — E039 Hypothyroidism, unspecified: Secondary | ICD-10-CM

## 2012-10-29 DIAGNOSIS — E119 Type 2 diabetes mellitus without complications: Secondary | ICD-10-CM

## 2012-10-29 DIAGNOSIS — R110 Nausea: Secondary | ICD-10-CM

## 2012-10-29 MED ORDER — ALPRAZOLAM 0.5 MG PO TABS: ORAL_TABLET | ORAL | Status: DC

## 2012-10-29 NOTE — Assessment & Plan Note (Signed)
Good medication compliance, check a hemoglobin A1c.

## 2012-10-29 NOTE — Assessment & Plan Note (Addendum)
Was seen with shoulder pain, prescribe Mobic, self discontinue MOBIC because her almost daily nausea got  slightly worse. Today presents with knee pain, most likely a DJD flare. Plan: Tylenol, offered a orthopedic surgical referral.

## 2012-10-29 NOTE — Assessment & Plan Note (Signed)
See  DJD

## 2012-10-29 NOTE — Assessment & Plan Note (Addendum)
Several months history of nausea without vomiting. She reports a remote history of gastritis, had a EGD. Currently on PPIs, no heartburn. DDX includes a medication (on metformin, low dose), gastritis, gallbladder, gastroparesis, others. Plan: Asked patient to hold metformin for 2 weeks to see if the nausea improves. Patient to let  me know. GI referral, increase Prilosec dose Also she has a history of anemia, will check iron and ferritin

## 2012-10-29 NOTE — Patient Instructions (Addendum)
Takes Prilosec 2 tablets daily Stop taking metformin for 2 weeks, see if that improves your nausea. Please come back in 6 weeks For pain, take Tylenol 500 mg 2 tablets 3 times a day as needed. --- TOME PRILOSEC 2 TABLETS AL DIA ANTES DEL DESAYUNO DEJE LA METFORMINA POR 2 SEMANAS SOLAMENTE, ME AVISA SI LE MEJORA LA NAUSEA REGRESE EN 6 SEMANAS PARAA EL DOLOR, TOME TYLENOL 500 MG 2 TABLETAS CADA 8 HORAS SI LO NECESITA

## 2012-10-29 NOTE — Assessment & Plan Note (Addendum)
Good compliance of medication, last TSH normal

## 2012-10-29 NOTE — Progress Notes (Signed)
  Subjective:    Patient ID: Kaylee Levine, female    DOB: 12/15/1933, 79 y.o.   MRN: [id number]  HPI Routine office visit Diabetes, on metformin, ambulatory blood sugars range from 100 to 120. High cholesterol, good medication compliance. Hypertension good medication compliance, ambulatory BPs reportedly normal. Hypothyroidism, good medication compliance. Today the patient reports nausea, she has it most mornings for the last 6 months, last for a few hours, not worse by eating. She has been on metformin for long time. See review of systems. Also, 3 days history of right knee pain.  Past medical history  Diabetes  Hypertension  Hyperlipidemia,  Thyroid disease  DJD  Osteoporosis, per gyn  Gyn-- Dr Lily Peer  Past surgical history  Appendectomy  Bladder surgery  Social history  Born in [place] to [place] in the 60s, moved to [place] ~ 2010  2 children, 1 son still in [place] lost a daughter  Lives by herself, 2 Gkids live in [place]  Tobacco-- never  ETOH-- no  Family history  Diabetes-- M  CAD-- M  Stroke-- no  Colon cancer-- no  Breast cancer-- no  Review of Systems Denies any headaches or weight loss. No chest pain or shortness of breath No vomiting, diarrhea, blood in the stools. Takes PPIs, currently with no heartburn. Occasionally has a ill-defined abdominal pain, "gas". Denies dysphasia to solids or liquids.     Objective:   Physical Exam General -- alert, well-developed  Neck, no lymphadenopathies, no supraclavicular mass Lungs -- normal respiratory effort, no intercostal retractions, no accessory muscle use, and normal breath sounds.   Heart-- normal rate, regular rhythm, no murmur, and no gallop.   Abdomen--not distended, good bowel sounds, slightly tender in the epigastric area.  Extremities-- trace pretibial edema L>R, varicose veins noted, no phlebitis. Both knees have DJD type of changes, right knee slightly swollen, no warm, no  effusion. Psych-- Cognition and judgment appear intact. Alert and cooperative with normal attention span and concentration.  not anxious appearing and not depressed appearing.        Assessment & Plan:  Needs something for nerves during flying, see Rx , warned about drowsiness

## 2012-10-29 NOTE — Assessment & Plan Note (Signed)
Well-controlled, check a CMP 

## 2012-10-30 ENCOUNTER — Encounter: Payer: Self-pay | Admitting: Gastroenterology

## 2012-10-30 LAB — FERRITIN: Ferritin: 56.4 ng/mL (ref 10.0–291.0)

## 2012-10-30 LAB — CBC WITH DIFFERENTIAL/PLATELET
Basophils Absolute: 0 10*3/uL (ref 0.0–0.1)
Basophils Relative: 0.8 % (ref 0.0–3.0)
Eosinophils Absolute: 0.1 10*3/uL (ref 0.0–0.7)
Lymphocytes Relative: 34 % (ref 12.0–46.0)
MCHC: 33.2 g/dL (ref 30.0–36.0)
Monocytes Absolute: 0.5 10*3/uL (ref 0.1–1.0)
Neutrophils Relative %: 54.7 % (ref 43.0–77.0)
Platelets: 244 10*3/uL (ref 150.0–400.0)
RBC: 4.2 Mil/uL (ref 3.87–5.11)
WBC: 4.9 10*3/uL (ref 4.5–10.5)

## 2012-10-30 LAB — COMPREHENSIVE METABOLIC PANEL
ALT: 14 U/L (ref 0–35)
Albumin: 3.8 g/dL (ref 3.5–5.2)
BUN: 13 mg/dL (ref 6–23)
CO2: 27 mEq/L (ref 19–32)
Calcium: 9.5 mg/dL (ref 8.4–10.5)
Chloride: 105 mEq/L (ref 96–112)
Creatinine, Ser: 1 mg/dL (ref 0.4–1.2)
GFR: 59.27 mL/min — ABNORMAL LOW (ref >60.00)
Potassium: 4.1 mEq/L (ref 3.5–5.1)

## 2012-10-30 LAB — IRON: Iron: 53 ug/dL (ref 42–145)

## 2012-11-04 ENCOUNTER — Encounter: Payer: Self-pay | Admitting: *Deleted

## 2012-11-13 ENCOUNTER — Other Ambulatory Visit: Payer: Self-pay | Admitting: Internal Medicine

## 2012-11-13 NOTE — Telephone Encounter (Signed)
Refill done.  

## 2012-11-15 ENCOUNTER — Ambulatory Visit: Payer: Medicare Other | Admitting: Gastroenterology

## 2012-11-20 ENCOUNTER — Telehealth: Payer: Self-pay | Admitting: Internal Medicine

## 2012-11-20 DIAGNOSIS — E039 Hypothyroidism, unspecified: Secondary | ICD-10-CM

## 2012-11-20 MED ORDER — LEVOTHYROXINE SODIUM 25 MCG PO TABS: 25.0000 ug | ORAL_TABLET | Freq: Every day | ORAL | Status: DC

## 2012-11-20 NOTE — Telephone Encounter (Signed)
Last OV 10-29-12,no refill history, last TSH 03-08-12  2.305.Please advise

## 2012-11-20 NOTE — Telephone Encounter (Signed)
Patient's friend states the patient is out of levothyroxine and the pharmacy has been trying to contact us. I do not see any documentation in chart from [facility]. Pt requesting that we send this to [facility] on [address]

## 2012-11-21 NOTE — Telephone Encounter (Addendum)
We just send the Rx to [facility], send it again if needed

## 2012-12-06 ENCOUNTER — Encounter: Payer: Self-pay | Admitting: Gastroenterology

## 2012-12-06 ENCOUNTER — Ambulatory Visit (INDEPENDENT_AMBULATORY_CARE_PROVIDER_SITE_OTHER): Payer: Medicare Other | Admitting: Gastroenterology

## 2012-12-06 VITALS — BP 146/82 | HR 76 | Ht 61.0 in | Wt 139.0 lb

## 2012-12-06 DIAGNOSIS — R110 Nausea: Secondary | ICD-10-CM

## 2012-12-06 DIAGNOSIS — K5900 Constipation, unspecified: Secondary | ICD-10-CM

## 2012-12-06 MED ORDER — MOVIPREP 100 G PO SOLR: 1.0000 | Freq: Once | ORAL | Status: AC

## 2012-12-06 NOTE — Patient Instructions (Addendum)
Start miralax powder (one dose every day) for your constipation. You will be set up for a colonoscopy (for your constipation) You will be set up for an upper endoscopy (for your nausea, LEC; moderate sedation).                                               We are excited to introduce MyChart, a new best-in-class service that provides you online access to important information in your electronic medical record. We want to make it easier for you to view your health information - all in one secure location - when and where you need it. We expect MyChart will enhance the quality of care and service we provide.  When you register for MyChart, you can:    View your test results.    Request appointments and receive appointment reminders via email.    Request medication renewals.    View your medical history, allergies, medications and immunizations.    Communicate with your physician's office through a password-protected site.    Conveniently print information such as your medication lists.  To find out if MyChart is right for you, please talk to a member of our clinical staff today. We will gladly answer your questions about this free health and wellness tool.  If you are age 77 or older and want a member of your family to have access to your record, you must provide written consent by completing a proxy form available at our office. Please speak to our clinical staff about guidelines regarding accounts for patients younger than age 32.  As you activate your MyChart account and need any technical assistance, please call the MyChart technical support line at [phone]) or email your question to [email or web address]. If you email your question(s), please include your name, a return phone number and the best time to reach you.  If you have non-urgent health-related questions, you can send a message to our office through MyChart at [email or web address]. If you have a  medical emergency, call 911.  Thank you for using MyChart as your new health and wellness resource!   MyChart licensed from [employer],  [id number]. Patents Pending.

## 2012-12-06 NOTE — Progress Notes (Signed)
HPI: This is a  very pleasant Spanish speaking only woman, 79 years old. She is here with a friend who acts as a Nurse, learning disability.  Nausea, but no vomiting. Also gassy. Nausea for 3 months.  No abdominal pains.  Fills up fast, earlier.  Nausea intermittent.    Has not lost weight.  Only tylenol for routine pains.  No pyrosis.  Is pretty constipated normally.  Can go 5-6 days without a BM. This constipation is actually new for her 1 month ago.  No overt GI bleeding.  She tells me she had a colonoscopy in [place] 10-15 years ago. She does not recall exact details of that.   Review of systems: Pertinent positive and negative review of systems were noted in the above HPI section. Complete review of systems was performed and was otherwise normal.    Past Medical History  Diagnosis Date  . Osteopenia   . Vitamin D deficiency   . Diabetes mellitus   . Keratosis, seborrheic     mons pubis  . Thyroid disease     Past Surgical History  Procedure Laterality Date  . Appendectomy      Current Outpatient Prescriptions  Medication Sig Dispense Refill  . alendronate (FOSAMAX) 70 MG tablet take 1 tablet by mouth every week WITH A FULL GLASS OF WATER.  12 tablet  0  . ALPRAZolam (XANAX) 0.5 MG tablet 1/2 OR 1 TAB ONCE A DAY AS NEEDED FOR FLYING  12 tablet  0  . Blood Glucose Monitoring Suppl (ONE TOUCH ULTRA SYSTEM KIT) W/DEVICE KIT 1 kit by Does not apply route once.  1 each  0  . Calcium Carbonate-Vitamin D (CALCIUM + D PO) Take by mouth. Calcium 1000 mg + Vitamin D3 1000 IU      . ferrous sulfate 325 (65 FE) MG tablet Take 325 mg by mouth daily with breakfast.      . glucose blood (ONE TOUCH TEST STRIPS) test strip Use as instructed  100 each  12  . HYDROcodone-acetaminophen (VICODIN) 5-500 MG per tablet take 1 tablet by mouth twice a day if needed for pain  30 tablet  0  . levothyroxine (SYNTHROID, LEVOTHROID) 25 MCG tablet Take 1 tablet (25 mcg total) by mouth daily.  30 tablet  5  .  lisinopril-hydrochlorothiazide (PRINZIDE,ZESTORETIC) 10-12.5 MG per tablet take 1 tablet by mouth every morning  30 tablet  6  . metFORMIN (GLUCOPHAGE) 500 MG tablet Take 1 tablet (500 mg total) by mouth daily with breakfast.  30 tablet  5  . omeprazole (PRILOSEC) 20 MG capsule       . omeprazole (PRILOSEC) 20 MG capsule take 1 capsule by mouth once daily  30 capsule  6  . simvastatin (ZOCOR) 40 MG tablet Take 1 tablet (40 mg total) by mouth at bedtime.  30 tablet  5   No current facility-administered medications for this visit.    Allergies as of 12/06/2012  . (No Known Allergies)    Family History  Problem Relation Age of Onset  . Diabetes Mother   . Hypertension Mother   . Heart disease Mother     History   Social History  . Marital Status: Widowed    Spouse Name: N/A    Number of Children: 2  . Years of Education: N/A   Occupational History  . Disabled    Social History Main Topics  . Smoking status: Never Smoker   . Smokeless tobacco: Never Used  . Alcohol Use:  No  . Drug Use: No  . Sexually Active: Not on file   Other Topics Concern  . Not on file   Social History Narrative  . No narrative on file       Physical Exam: BP 146/82  Pulse 76  Ht 5\' 1"  (1.549 m)  Wt 139 lb (63.05 kg)  BMI 26.28 kg/m2 Constitutional: generally well-appearing Psychiatric: alert and oriented x3 Eyes: extraocular movements intact Mouth: oral pharynx moist, no lesions Neck: supple no lymphadenopathy Cardiovascular: heart regular rate and rhythm Lungs: clear to auscultation bilaterally Abdomen: soft, nontender, nondistended, no obvious ascites, no peritoneal signs, normal bowel sounds Extremities: no lower extremity edema bilaterally Skin: no lesions on visible extremities    Assessment and plan: 79 y.o. female with  nausea, constipation  She is going to start taking MiraLax one dose daily for her chronic constipation. Perhaps this is also adding to her nausea. I would  like to proceed with colonoscopy as well as upper endoscopy for the above complaints. She has not had colonoscopy and at least 10-15 years and I do think she's ever had upper endoscopy.

## 2012-12-10 ENCOUNTER — Ambulatory Visit: Payer: Medicare Other | Admitting: Internal Medicine

## 2012-12-14 ENCOUNTER — Telehealth: Payer: Self-pay | Admitting: Internal Medicine

## 2012-12-16 NOTE — Telephone Encounter (Signed)
Ok to refill? Last OV 2.18.14 Last filled 2.18.14

## 2012-12-16 NOTE — Telephone Encounter (Signed)
done

## 2012-12-20 ENCOUNTER — Ambulatory Visit: Payer: Medicare Other | Admitting: Internal Medicine

## 2012-12-24 LAB — HM DIABETES EYE EXAM

## 2013-01-01 ENCOUNTER — Ambulatory Visit (INDEPENDENT_AMBULATORY_CARE_PROVIDER_SITE_OTHER): Payer: Medicare Other | Admitting: Ophthalmology

## 2013-01-07 ENCOUNTER — Encounter: Payer: Medicare Other | Admitting: Gastroenterology

## 2013-01-10 ENCOUNTER — Encounter: Payer: Self-pay | Admitting: *Deleted

## 2013-02-10 ENCOUNTER — Other Ambulatory Visit: Payer: Self-pay | Admitting: Internal Medicine

## 2013-02-10 NOTE — Telephone Encounter (Signed)
Refill done.  

## 2013-02-26 ENCOUNTER — Ambulatory Visit (INDEPENDENT_AMBULATORY_CARE_PROVIDER_SITE_OTHER): Payer: Medicare Other | Admitting: Ophthalmology

## 2013-02-28 ENCOUNTER — Ambulatory Visit: Payer: Medicare Other | Admitting: Internal Medicine

## 2013-03-12 ENCOUNTER — Telehealth: Payer: Self-pay | Admitting: Internal Medicine

## 2013-03-12 NOTE — Telephone Encounter (Signed)
Pt only speaks spanish.  Faxed back rx request asking pharmacy to re-send by escribe.

## 2013-03-12 NOTE — Telephone Encounter (Signed)
REFILL:C/S ACCU-CHEK AVIVA SOL AUTOLET IMPRESSION DEVICE LANCET ULTILET

## 2013-03-12 NOTE — Telephone Encounter (Signed)
Refill: Accu-chek aviva plus Battery energizer [phone] T/s accu-chk aviva plus

## 2013-03-17 ENCOUNTER — Other Ambulatory Visit: Payer: Self-pay | Admitting: Gynecology

## 2013-03-25 ENCOUNTER — Encounter: Payer: Self-pay | Admitting: Internal Medicine

## 2013-03-25 ENCOUNTER — Ambulatory Visit (INDEPENDENT_AMBULATORY_CARE_PROVIDER_SITE_OTHER): Payer: 59 | Admitting: Internal Medicine

## 2013-03-25 ENCOUNTER — Telehealth: Payer: Self-pay | Admitting: *Deleted

## 2013-03-25 VITALS — BP 170/80 | HR 83 | Temp 98.1°F | Wt 134.6 lb

## 2013-03-25 DIAGNOSIS — R110 Nausea: Secondary | ICD-10-CM

## 2013-03-25 DIAGNOSIS — E7800 Pure hypercholesterolemia, unspecified: Secondary | ICD-10-CM

## 2013-03-25 DIAGNOSIS — E039 Hypothyroidism, unspecified: Secondary | ICD-10-CM

## 2013-03-25 DIAGNOSIS — F411 Generalized anxiety disorder: Secondary | ICD-10-CM

## 2013-03-25 DIAGNOSIS — E119 Type 2 diabetes mellitus without complications: Secondary | ICD-10-CM

## 2013-03-25 DIAGNOSIS — I10 Essential (primary) hypertension: Secondary | ICD-10-CM

## 2013-03-25 MED ORDER — LEVOTHYROXINE SODIUM 25 MCG PO TABS: 25.0000 ug | ORAL_TABLET | Freq: Every day | ORAL | Status: DC

## 2013-03-25 MED ORDER — ALENDRONATE SODIUM 70 MG PO TABS: ORAL_TABLET | ORAL | Status: AC

## 2013-03-25 MED ORDER — SIMVASTATIN 40 MG PO TABS: 40.0000 mg | ORAL_TABLET | Freq: Every day | ORAL | Status: AC

## 2013-03-25 MED ORDER — ALPRAZOLAM 0.5 MG PO TABS: 0.2500 mg | ORAL_TABLET | Freq: Every evening | ORAL | Status: AC | PRN

## 2013-03-25 MED ORDER — OMEPRAZOLE 20 MG PO CPDR: DELAYED_RELEASE_CAPSULE | ORAL | Status: DC

## 2013-03-25 MED ORDER — LISINOPRIL-HYDROCHLOROTHIAZIDE 10-12.5 MG PO TABS: ORAL_TABLET | ORAL | Status: AC

## 2013-03-25 MED ORDER — METFORMIN HCL 500 MG PO TABS: ORAL_TABLET | ORAL | Status: AC

## 2013-03-25 NOTE — Progress Notes (Signed)
  Subjective:    Patient ID: Kaylee Levine, female    DOB: 1934-04-11, 79 y.o.   MRN: [id number]  HPI Here to discuss the following issues Complained of dizziness for several days, described as a spinning, initially had the symptoms when she turned in bed, now better but he still has dizziness in the morning and gradually decreased throughout the day, worse w/ head motion? Pt not sure. Occasionally has nausea. Denies diplopia, motor deficits, slurred speech, facial paralysis. Diabetes, good compliance with medications, CBG today 117. H/o nausea, saw GI, they recommended EGD and a colonoscopy which are pending. Nausea has actually subsided.   Past medical history   Diabetes   Hypertension   Hyperlipidemia,   Thyroid disease   DJD   Osteoporosis, per gyn   Gyn-- Dr Lily Peer   Past surgical history   Appendectomy   Bladder surgery   Social history   Born in [place] to [place] in the 60s, moved to [place] ~ 2010   2 children, 1 son still in [place] lost a daughter   Lives by herself, 2 Gkids live in [place]   Tobacco-- never   ETOH-- no   Family history   Diabetes-- M   CAD-- M   Stroke-- no   Colon cancer-- no   Breast cancer-- no   Review of Systems No chest pain or shortness or breath No vomiting, diarrhea or blood in the stools. Also, reports a long history of anxiety and some insomnia. Denies depression. At some point I prescribed alprazolam to help with "nerves" when she takes  airplane trips, she took half tablet few days ago, said that help her feel better and slept well. Request a prescription.     Objective:   Physical Exam BP 170/80  Pulse 83  Temp(Src) 98.1 F (36.7 C) (Oral)  Wt 134 lb 10.1 oz (61.068 kg)  BMI 25.45 kg/m2  SpO2 96%  General -- alert, well-developed, NAD.   Neck -- normal carotid pulse, no bruit  Lungs -- normal respiratory effort, no intercostal retractions, no accessory muscle use, and normal breath sounds.    Heart-- normal rate, regular rhythm, no murmur, and no gallop.   Extremities-- L Distal lower extremity slightly swollen compared to the right, ++ varicose veins w/o phlebitis  Neurologic-- alert & oriented X3 ; Speech fluent and clear, face symmetric,EOMI, pupils equal and L one slt hyporeactive (Had surgery and that eye). DTRs symmetric. Gait normal.. Psych-- Cognition and judgment appear intact. Alert and cooperative with normal attention span and concentration.  slt anxious appearing and not depressed appearing.        Assessment & Plan:  Dizziness, Likely a peripheral issue, neurological exam normal. Recommend observation, knows to call me if  not better in few days , symptoms severe or are associated w/ stroke symptoms

## 2013-03-25 NOTE — Assessment & Plan Note (Signed)
Saw GI, and they recommended that EGD and a colonoscopy, symptoms actually subsided. Recommend followup with GI

## 2013-03-25 NOTE — Telephone Encounter (Signed)
Noted that Fosamax is being prescribed by pt. GYN. Contacted [facility] to please cancel this reorder placed on 03/25/13. Spoke with Magda Paganini, pharmacist for [facility] who left note to cancel rx once it arrives via e-script.

## 2013-03-25 NOTE — Assessment & Plan Note (Signed)
Good control, check a BMP

## 2013-03-25 NOTE — Assessment & Plan Note (Addendum)
See review of systems, Long history of anxiety without depression but some insomnia; has a leftover Xanax, took half tablet for insomnia and it helped a great deal. Plan: Xanax as needed for insomnia

## 2013-03-25 NOTE — Assessment & Plan Note (Signed)
Check a TSH 

## 2013-03-25 NOTE — Assessment & Plan Note (Signed)
Due for labs, see instructions  

## 2013-03-25 NOTE — Assessment & Plan Note (Signed)
Check a hemoglobin A1c, continue with metformin (nausea resolved)

## 2013-03-25 NOTE — Patient Instructions (Addendum)
Regrese con el estomago vacio para hacerse analisis: Come back fasting: FLP , A1c --- dx diabetes BMP --- dx hypertension TSH--- dx  hypothyroidism ---- Next visit in 4 months, physical exam

## 2013-04-17 ENCOUNTER — Other Ambulatory Visit: Payer: Self-pay | Admitting: Gynecology

## 2013-04-21 ENCOUNTER — Encounter: Payer: Self-pay | Admitting: Gynecology

## 2013-05-08 ENCOUNTER — Encounter: Payer: Self-pay | Admitting: Lab

## 2013-05-09 ENCOUNTER — Other Ambulatory Visit: Payer: Medicare Other

## 2013-05-15 ENCOUNTER — Other Ambulatory Visit: Payer: Self-pay | Admitting: Internal Medicine

## 2013-05-15 NOTE — Telephone Encounter (Signed)
Med filled.  

## 2013-06-16 ENCOUNTER — Ambulatory Visit (INDEPENDENT_AMBULATORY_CARE_PROVIDER_SITE_OTHER): Payer: Medicare Other | Admitting: Ophthalmology

## 2013-07-31 ENCOUNTER — Encounter: Payer: Self-pay | Admitting: Lab

## 2013-07-31 ENCOUNTER — Telehealth: Payer: Self-pay

## 2013-07-31 NOTE — Telephone Encounter (Signed)
Unable to reach patient due to incorrect contact information

## 2013-08-01 ENCOUNTER — Encounter: Payer: Medicare Other | Admitting: Internal Medicine

## 2014-07-13 ENCOUNTER — Encounter: Payer: Self-pay | Admitting: Internal Medicine

## 2016-01-10 ENCOUNTER — Encounter (INDEPENDENT_AMBULATORY_CARE_PROVIDER_SITE_OTHER): Payer: Medicare Other | Admitting: Ophthalmology

## 2016-11-29 ENCOUNTER — Encounter (HOSPITAL_COMMUNITY): Payer: Self-pay | Admitting: *Deleted

## 2016-11-29 ENCOUNTER — Emergency Department (HOSPITAL_COMMUNITY)
Admission: EM | Admit: 2016-11-29 | Discharge: 2016-11-29 | Disposition: A | Discharge status: Home / Self Care | Payer: Medicare Other | Attending: Emergency Medicine | Admitting: Emergency Medicine

## 2016-11-29 ENCOUNTER — Emergency Department (HOSPITAL_COMMUNITY): Payer: Medicare Other

## 2016-11-29 ENCOUNTER — Ambulatory Visit (HOSPITAL_COMMUNITY)
Admission: EM | Admit: 2016-11-29 | Discharge: 2016-11-29 | Disposition: A | Discharge status: Home / Self Care | Payer: Medicare Other | Source: Home / Self Care | Attending: Emergency Medicine | Admitting: Emergency Medicine

## 2016-11-29 ENCOUNTER — Encounter (HOSPITAL_COMMUNITY): Payer: Self-pay | Admitting: Neurology

## 2016-11-29 DIAGNOSIS — Z7984 Long term (current) use of oral hypoglycemic drugs: Secondary | ICD-10-CM | POA: Diagnosis not present

## 2016-11-29 DIAGNOSIS — R42 Dizziness and giddiness: Secondary | ICD-10-CM | POA: Diagnosis not present

## 2016-11-29 DIAGNOSIS — Z79899 Other long term (current) drug therapy: Secondary | ICD-10-CM | POA: Diagnosis not present

## 2016-11-29 DIAGNOSIS — I4891 Unspecified atrial fibrillation: Secondary | ICD-10-CM | POA: Diagnosis not present

## 2016-11-29 DIAGNOSIS — E039 Hypothyroidism, unspecified: Secondary | ICD-10-CM | POA: Diagnosis not present

## 2016-11-29 DIAGNOSIS — H81391 Other peripheral vertigo, right ear: Secondary | ICD-10-CM

## 2016-11-29 DIAGNOSIS — R9431 Abnormal electrocardiogram [ECG] [EKG]: Secondary | ICD-10-CM | POA: Diagnosis not present

## 2016-11-29 DIAGNOSIS — I10 Essential (primary) hypertension: Secondary | ICD-10-CM | POA: Diagnosis not present

## 2016-11-29 DIAGNOSIS — E119 Type 2 diabetes mellitus without complications: Secondary | ICD-10-CM | POA: Diagnosis not present

## 2016-11-29 LAB — COMPREHENSIVE METABOLIC PANEL
ALK PHOS: 61 U/L (ref 38–126)
ALT: 15 U/L (ref 14–54)
AST: 21 U/L (ref 15–41)
Albumin: 3.8 g/dL (ref 3.5–5.0)
Anion gap: 10 (ref 5–15)
BILIRUBIN TOTAL: 0.5 mg/dL (ref 0.3–1.2)
BUN: 14 mg/dL (ref 6–20)
CALCIUM: 9.3 mg/dL (ref 8.9–10.3)
CHLORIDE: 96 mmol/L — AB (ref 101–111)
CO2: 26 mmol/L (ref 22–32)
Creatinine, Ser: 0.9 mg/dL (ref 0.44–1.00)
GFR, EST NON AFRICAN AMERICAN: 59 mL/min — AB (ref >60)
GLUCOSE: 123 mg/dL — AB (ref 65–99)
POTASSIUM: 3.9 mmol/L (ref 3.5–5.1)
Sodium: 132 mmol/L — ABNORMAL LOW (ref 135–145)
Total Protein: 6.9 g/dL (ref 6.5–8.1)

## 2016-11-29 LAB — CBC WITH DIFFERENTIAL/PLATELET
Basophils Absolute: 0 10*3/uL (ref 0.0–0.1)
Basophils Relative: 0 %
Eosinophils Absolute: 0.1 10*3/uL (ref 0.0–0.7)
Eosinophils Relative: 1 %
HEMATOCRIT: 34.5 % — AB (ref 36.0–46.0)
Hemoglobin: 11.2 g/dL — ABNORMAL LOW (ref 12.0–15.0)
LYMPHS ABS: 1.4 10*3/uL (ref 0.7–4.0)
LYMPHS PCT: 20 %
MCH: 26.6 pg (ref 26.0–34.0)
MCHC: 32.5 g/dL (ref 30.0–36.0)
MCV: 81.9 fL (ref 78.0–100.0)
Monocytes Absolute: 0.5 10*3/uL (ref 0.1–1.0)
Monocytes Relative: 7 %
Neutro Abs: 5.1 10*3/uL (ref 1.7–7.7)
Neutrophils Relative %: 72 %
PLATELETS: 257 10*3/uL (ref 150–400)
RBC: 4.21 MIL/uL (ref 3.87–5.11)
RDW: 13.7 % (ref 11.5–15.5)
WBC: 7.1 10*3/uL (ref 4.0–10.5)

## 2016-11-29 LAB — URINALYSIS, ROUTINE W REFLEX MICROSCOPIC
BILIRUBIN URINE: NEGATIVE
Glucose, UA: NEGATIVE mg/dL
HGB URINE DIPSTICK: NEGATIVE
Ketones, ur: NEGATIVE mg/dL
Leukocytes, UA: NEGATIVE
Nitrite: NEGATIVE
PROTEIN: NEGATIVE mg/dL
SPECIFIC GRAVITY, URINE: 1.012 (ref 1.005–1.030)
pH: 7 (ref 5.0–8.0)

## 2016-11-29 LAB — I-STAT TROPONIN, ED: Troponin i, poc: 0.02 ng/mL (ref 0.00–0.08)

## 2016-11-29 MED ORDER — OMEPRAZOLE 20 MG PO CPDR: DELAYED_RELEASE_CAPSULE | ORAL | 0 refills | Status: AC

## 2016-11-29 MED ORDER — SODIUM CHLORIDE 0.9 % IV BOLUS (SEPSIS)
500.0000 mL | Freq: Once | INTRAVENOUS | Status: AC
  Administered 2016-11-29: 500 mL via INTRAVENOUS

## 2016-11-29 MED ORDER — MECLIZINE HCL 25 MG PO TABS: 25.0000 mg | ORAL_TABLET | Freq: Three times a day (TID) | ORAL | 0 refills | Status: DC | PRN

## 2016-11-29 NOTE — ED Notes (Signed)
Patient transported to X-ray 

## 2016-11-29 NOTE — ED Notes (Signed)
ekg handed to dr mortenson 

## 2016-11-29 NOTE — ED Triage Notes (Addendum)
Per ems- pt is coming from urgent care with 2 week history of dizziness and GERD. Abnormal EKG, with AFIB., LBBB She is c/o heart burn. Sent here for cardiac work up. Is non-english speaking. Daughter is here who speaks english. BP 180/93. She has been taking antibiotics since 3/13 for ear infection.

## 2016-11-29 NOTE — ED Notes (Signed)
Patient transported to MRI 

## 2016-11-29 NOTE — ED Provider Notes (Signed)
[facility] DEPT Provider Note   CSN: [id number] Arrival date & time: 11/29/16  1512     History   Chief Complaint Chief Complaint  Patient presents with  . Dizziness    HPI Kaylee Levine is a 83 y.o. female.  HPI Patient since with acute onset dizziness that started yesterday evening. Described as room spinning sensation associated with nausea. Worse with certain movements. Had similar symptoms this morning which prompted her visit to the urgent care. Noted to be in atrial fibrillation and transfer to [facility] emergency department. Patient has no previous cardiac history. She denies palpitations, shortness of breath or chest pain. She has intermittent indigestion which she describes as burning radiating up into the chest but denies any currently. No new lower extremity swelling or asymmetry. Denies any visual or speech changes. States she's being treated for a right ear infection. Had tinnitus in the right ear last night. No other hearing changes. No fever or chills. No focal weakness or numbness. Patient has had difficulty with balance for the past one to 2 weeks. Past Medical History:  Diagnosis Date  . Diabetes mellitus   . Keratosis, seborrheic    mons pubis  . Osteopenia   . Thyroid disease   . Vitamin D deficiency     Patient Active Problem List   Diagnosis Date Noted  . Anxiety , Insomnia 03/25/2013  . Nausea alone 10/29/2012  . DJD (degenerative joint disease) 03/08/2012  . Diabetes mellitus ([place]) 02/02/2012  . Hypercholesterolemia 07/17/2011  . Hypothyroidism 07/17/2011  . Hypertension 07/17/2011  . Anemia 07/17/2011  . Osteopenia 07/17/2011    Past Surgical History:  Procedure Laterality Date  . APPENDECTOMY      OB History    Gravida Para Term Preterm AB Living   '2 2 2     2   '$ SAB TAB Ectopic Multiple Live Births           2       Home Medications    Prior to Admission medications   Medication Sig Start Date End Date Taking?  Authorizing Provider  acetaminophen (TYLENOL) 650 MG CR tablet Take 650 mg by mouth every 8 (eight) hours as needed for pain.   Yes Historical Provider, MD  Blood Glucose Monitoring Suppl (ONE TOUCH ULTRA SYSTEM KIT) W/DEVICE KIT 1 kit by Does not apply route once. 06/03/12  Yes Colon Branch, MD  ferrous sulfate 325 (65 FE) MG tablet Take 325 mg by mouth daily with breakfast.   Yes Historical Provider, MD  glucose blood (ONE TOUCH TEST STRIPS) test strip Use as instructed 06/07/12  Yes Colon Branch, MD  levothyroxine (SYNTHROID, LEVOTHROID) 25 MCG tablet take 1 tablet by mouth once daily 05/15/13  Yes Colon Branch, MD  lisinopril-hydrochlorothiazide (PRINZIDE,ZESTORETIC) 10-12.5 MG per tablet take 1 tablet by mouth every morning 03/25/13  Yes Colon Branch, MD  metFORMIN (GLUCOPHAGE) 500 MG tablet take 1 tablet by mouth daily with BREAKFAST 03/25/13  Yes Colon Branch, MD  simvastatin (ZOCOR) 40 MG tablet Take 1 tablet (40 mg total) by mouth at bedtime. 03/25/13 11/29/16 Yes Colon Branch, MD  alendronate (FOSAMAX) 70 MG tablet TAKE 1 TABLET BY MOUTH ONCE WEEKLY. TAKE WITH A FULL GLASS OF WATER ON EMPTY STOMACH. Patient not taking: Reported on 11/29/2016 03/25/13   Colon Branch, MD  ALPRAZolam Duanne Moron) 0.5 MG tablet Take 0.5-1 tablets (0.25-0.5 mg total) by mouth at bedtime as needed for sleep. Patient not taking: Reported on  11/29/2016 03/25/13   Colon Branch, MD  HYDROcodone-acetaminophen (VICODIN) 5-500 MG per tablet take 1 tablet by mouth twice a day if needed for pain Patient not taking: Reported on 11/29/2016 05/15/12   Colon Branch, MD  meclizine (ANTIVERT) 25 MG tablet Take 1 tablet (25 mg total) by mouth 3 (three) times daily as needed for dizziness. 11/29/16   Julianne Rice, MD  MOVIPREP 100 G SOLR Take 1 kit (100 g total) by mouth once. Patient not taking: Reported on 11/29/2016 12/06/12   Milus Banister, MD  omeprazole (PRILOSEC) 20 MG capsule take 1 capsule by mouth once daily 11/29/16   Julianne Rice, MD    Family  History Family History  Problem Relation Age of Onset  . Diabetes Mother   . Hypertension Mother   . Heart disease Mother     Social History Social History  Substance Use Topics  . Smoking status: Never Smoker  . Smokeless tobacco: Never Used  . Alcohol use No     Allergies   Patient has no known allergies.   Review of Systems Review of Systems  Constitutional: Negative for chills and fever.  HENT: Positive for ear pain. Negative for congestion, hearing loss, trouble swallowing and voice change.   Eyes: Negative for visual disturbance.  Respiratory: Negative for cough and shortness of breath.   Cardiovascular: Negative for chest pain, palpitations and leg swelling.  Gastrointestinal: Positive for nausea. Negative for abdominal pain, constipation, diarrhea and vomiting.  Genitourinary: Negative for dysuria, flank pain and frequency.  Musculoskeletal: Positive for gait problem. Negative for back pain, myalgias, neck pain and neck stiffness.  Skin: Negative for rash and wound.  Neurological: Positive for dizziness. Negative for syncope, weakness, light-headedness, numbness and headaches.  All other systems reviewed and are negative.    Physical Exam Updated Vital Signs BP (!) 156/78   Pulse 90   Temp 98 F (36.7 C) (Oral)   Resp 17   SpO2 100%   Physical Exam  Constitutional: She is oriented to person, place, and time. She appears well-developed and well-nourished. No distress.  HENT:  Head: Normocephalic and atraumatic.  Mouth/Throat: Oropharynx is clear and moist. No oropharyngeal exudate.  Difficult to visualize right TM. Left TM is normal.  Eyes: EOM are normal. Pupils are equal, round, and reactive to light.  Horizontal nystagmus. Appears fatigable  Neck: Normal range of motion. Neck supple.  No meningismus. Normal range of motion of the neck. No bruits.  Cardiovascular: Normal rate and regular rhythm.  Exam reveals no gallop and no friction rub.   No murmur  heard. Pulmonary/Chest: Effort normal and breath sounds normal. No respiratory distress. She has no wheezes. She has no rales. She exhibits no tenderness.  Abdominal: Soft. Bowel sounds are normal. There is no tenderness. There is no rebound and no guarding.  Musculoskeletal: Normal range of motion. She exhibits no edema or tenderness.  No lower extremity swelling, asymmetry or tenderness. 2+ distal pulses in all extremities.  Neurological: She is alert and oriented to person, place, and time.  Patient is alert and oriented x3 with clear, goal oriented speech. Patient has 5/5 motor in all extremities. Sensation is intact to light touch. Bilateral finger-to-nose is normal with no signs of dysmetria.  Skin: Skin is warm and dry. Capillary refill takes less than 2 seconds. No rash noted. No erythema.  Psychiatric: She has a normal mood and affect. Her behavior is normal.  Nursing note and vitals reviewed.  ED Treatments / Results  Labs (all labs ordered are listed, but only abnormal results are displayed) Labs Reviewed  CBC WITH DIFFERENTIAL/PLATELET - Abnormal; Notable for the following:       Result Value   Hemoglobin 11.2 (*)    HCT 34.5 (*)    All other components within normal limits  COMPREHENSIVE METABOLIC PANEL - Abnormal; Notable for the following:    Sodium 132 (*)    Chloride 96 (*)    Glucose, Bld 123 (*)    GFR calc non Af Amer 59 (*)    All other components within normal limits  URINALYSIS, ROUTINE W REFLEX MICROSCOPIC - Abnormal; Notable for the following:    Color, Urine STRAW (*)    All other components within normal limits  I-STAT TROPOININ, ED    EKG  EKG Interpretation None       Radiology Dg Chest 2 View  Result Date: 11/29/2016 CLINICAL DATA:  To the history of dizziness and gastroesophageal reflux. Chest pain. EXAM: CHEST  2 VIEW COMPARISON:  Two-view chest x-ray 12/11/2006 FINDINGS: The heart size is normal. Lung volumes are low. There is asymmetric  elevation of the right hemidiaphragm. There is no edema or effusion. Atherosclerotic changes are noted at the aortic arch. No focal airspace consolidation is present. Degenerative changes are present at the shoulders bilaterally. IMPRESSION: 1. Low lung volumes. 2. No acute cardiopulmonary disease. 3. Aortic atherosclerosis. Electronically Signed   By: San Morelle M.D.   On: 11/29/2016 17:14   Mr Brain Wo Contrast  Result Date: 11/29/2016 CLINICAL DATA:  Intermittent dizziness beginning last night. Walls balance and vertigo. Episodes last 5-10 minutes and then resolve. Tinnitus is present with the episodes. Symptoms are not positional. Initial encounter. EXAM: MRI HEAD WITHOUT CONTRAST TECHNIQUE: Multiplanar, multiecho pulse sequences of the brain and surrounding structures were obtained without intravenous contrast. COMPARISON:  None. FINDINGS: Brain: Diffusion signal changes within the splenium of the corpus callosum on the left are not evident on the coronal images and likely artifactual. Mild atrophy and white matter change is within normal limits for age. No acute infarct, hemorrhage, or mass lesion is present. The internal auditory canals are within normal limits bilaterally. The brainstem and cerebellum are unremarkable. Vascular: Flow is present in the major intracranial arteries. Skull and upper cervical spine: The skullbase is within normal limits. The craniocervical junction is normal. Marrow signal is normal. The upper cervical spine is unremarkable. Midline sagittal structures are within normal limits. Sinuses/Orbits: Minimal mucosal thickening is present within the ethmoid air cells. The remaining paranasal sinuses and the mastoid air cells are clear. Bilateral lens replacements are present. There is scleral banding on the left. The globes and orbits are otherwise within normal limits. IMPRESSION: 1. No acute or focal abnormality to explain the patient's symptoms. 2. Normal MRI the brain  for age. Electronically Signed   By: San Morelle M.D.   On: 11/29/2016 19:37    Procedures Procedures (including critical care time)  Medications Ordered in ED Medications  sodium chloride 0.9 % bolus 500 mL (0 mLs Intravenous Stopped 11/29/16 1827)     Initial Impression / Assessment and Plan / ED Course  I have reviewed the triage vital signs and the nursing notes.  Pertinent labs & imaging results that were available during my care of the patient were reviewed by me and considered in my medical decision making (see chart for details).    CHA2DS2/VAS Stroke Risk Points      5 >=  2 Points: High Risk  1 - 1.99 Points: Medium Risk  0 Points: Low Risk    The patient's score has not changed in the past year.:  No Change         Details    Note: External data might be a factor in metrics not marked with    Points Metrics   This score determines the patient's risk of having a stroke if the  patient has atrial fibrillation.       0 Has Congestive Heart Failure:  No   0 Has Vascular Disease:  No   1 Has Hypertension:  Yes   2 Age:  67   1 Has Diabetes:  Yes   0 Had Stroke:  No Had TIA:  No Had thromboembolism:  No   1 Female:  Yes         Patient's dizziness has completely resolved. She is ambulating without any assistance. MRI without evidence of acute stroke. Likely peripheral vertigo due to recent ear infection. Patient does have atrial fibrillation on initial EKG. She is in sinus rhythm currently. Denying any chest pain or shortness of breath. She has a CHA2DS2Vasc score of 5. She will need close follow-up in the A. fib clinic. Have conveyed this to the patient. They will call the clinic in the morning to schedule an appointment. Patient is to follow-up with her primary physician and with ENT for ongoing ear symptoms tinnitus and vertigo. Will discharge home with meclizine. Return precautions have been given.  No evidence of cardiac ischemia. Patient has not been  taking her PPI. We'll restart Prilosec. Final Clinical Impressions(s) / ED Diagnoses   Final diagnoses:  Peripheral vertigo involving right ear  New onset atrial fibrillation (HCC)    New Prescriptions New Prescriptions   MECLIZINE (ANTIVERT) 25 MG TABLET    Take 1 tablet (25 mg total) by mouth 3 (three) times daily as needed for dizziness.     Julianne Rice, MD 11/29/16 2112

## 2016-11-29 NOTE — ED Notes (Signed)
Nira Connony bowles, rn reports to Italychad rn about patient coming to ed

## 2016-11-29 NOTE — ED Provider Notes (Addendum)
HPI  SUBJECTIVE:  Kaylee Levine is a 83 y.o. female who presents with intermittent dizziness starting last night. She describes as it feeling off balance and as vertigo. Last 5-10 minutes and then resolves. She reports tinnitus. Is not associated with turning her head, bending forward, rolling over in bed, tilting her head back, lying down or with exertion. She denies headache, visual changes, facial rash. No arm or leg weakness, facial droop, dysarthria, aphasia. No ear pain. No chest pain or pressure, heaviness, burning, palpitations. No presyncope, syncope. She reports nausea but it is not associated with the dizziness. She denies vomiting or diarrhea. She has not had any change in her medications other than being started on Augmentin for a right-sided ear infection. She does not take NSAIDs on a regular basis. She also reports nausea and "heartburn" that she has had previously. This consists of belching water brash. Thinks that this may be due to the Augmentin that she is on. Again denies chest pain. She has a past medical history of GERD, hypertension. States that she took her blood pressure medicines today. Does not know what her baseline blood pressure is. Also history of diabetes. States that her sugar was within normal limits this morning. Anemia, hypothyroidism, hypercholesterolemia. No history of stroke, MI, coronary disease, arrhythmia, atrial fibrillation. JSR:PRXY Larose Kells, MD    Past Medical History:  Diagnosis Date  . Diabetes mellitus   . Keratosis, seborrheic    mons pubis  . Osteopenia   . Thyroid disease   . Vitamin D deficiency     Past Surgical History:  Procedure Laterality Date  . APPENDECTOMY      Family History  Problem Relation Age of Onset  . Diabetes Mother   . Hypertension Mother   . Heart disease Mother     Social History  Substance Use Topics  . Smoking status: Never Smoker  . Smokeless tobacco: Never Used  . Alcohol use No    No current  facility-administered medications for this encounter.   Current Outpatient Prescriptions:  .  alendronate (FOSAMAX) 70 MG tablet, TAKE 1 TABLET BY MOUTH ONCE WEEKLY. TAKE WITH A FULL GLASS OF WATER ON EMPTY STOMACH., Disp: 12 tablet, Rfl: 1 .  ALPRAZolam (XANAX) 0.5 MG tablet, Take 0.5-1 tablets (0.25-0.5 mg total) by mouth at bedtime as needed for sleep., Disp: 30 tablet, Rfl: 1 .  Blood Glucose Monitoring Suppl (ONE TOUCH ULTRA SYSTEM KIT) W/DEVICE KIT, 1 kit by Does not apply route once., Disp: 1 each, Rfl: 0 .  glucose blood (ONE TOUCH TEST STRIPS) test strip, Use as instructed, Disp: 100 each, Rfl: 12 .  HYDROcodone-acetaminophen (VICODIN) 5-500 MG per tablet, take 1 tablet by mouth twice a day if needed for pain, Disp: 30 tablet, Rfl: 0 .  levothyroxine (SYNTHROID, LEVOTHROID) 25 MCG tablet, take 1 tablet by mouth once daily, Disp: 30 tablet, Rfl: 5 .  lisinopril-hydrochlorothiazide (PRINZIDE,ZESTORETIC) 10-12.5 MG per tablet, take 1 tablet by mouth every morning, Disp: 90 tablet, Rfl: 1 .  metFORMIN (GLUCOPHAGE) 500 MG tablet, take 1 tablet by mouth daily with BREAKFAST, Disp: 90 tablet, Rfl: 1 .  MOVIPREP 100 G SOLR, Take 1 kit (100 g total) by mouth once., Disp: 1 kit, Rfl: 0 .  omeprazole (PRILOSEC) 20 MG capsule, take 1 capsule by mouth once daily, Disp: 90 capsule, Rfl: 1 .  simvastatin (ZOCOR) 40 MG tablet, Take 1 tablet (40 mg total) by mouth at bedtime., Disp: 90 tablet, Rfl: 1  No Known Allergies  ROS  As noted in HPI.   Physical Exam  BP (!) 187/74 (BP Location: Right Arm)   Pulse 93   Temp 98.6 F (37 C) (Oral)   Resp 18   SpO2 100%    Orthostatic VS for the past 24 hrs:  BP- Lying Pulse- Lying BP- Sitting Pulse- Sitting BP- Standing at 0 minutes Pulse- Standing at 0 minutes  11/29/16 1423 (!) 202/92 104 200/82 105 198/83 101      BP Readings from Last 3 Encounters:  11/29/16 (!) 187/74  03/25/13 (!) 170/80  12/06/12 (!) 146/82   Constitutional: Well  developed, well nourished, no acute distress Eyes: PERRL, EOMI, conjunctiva normal bilaterally HENT: Normocephalic, atraumatic,mucus membranes moist TMs normal bilaterally. No air-fluid levels. Respiratory: Clear to auscultation bilaterally, no rales, no wheezing, no rhonchi Cardiovascular: Normal rate and rhythm, + systolic ejection murmur, no gallops, no rubs. No carotid bruit GI: Nondistended Musculoskeletal:  no deformities Neurologic: Alert & oriented x 3, CN II-XII intact,, finger nose heel shin within normal limits. Romberg negative. Difficulty with tandem gait.  weakly positive Dix-Hallpike on the left side. No nystagmus.  motor deficits, sensation grossly intact Psychiatric: Speech and behavior appropriate   ED Course   Medications - No data to display  Orders Placed This Encounter  Procedures  . Orthostatic vital signs    Standing Status:   Standing    Number of Occurrences:   1  . ED EKG    dizziness    Standing Status:   Standing    Number of Occurrences:   1    Order Specific Question:   Reason for Exam    Answer:   Other (See Comments)   No results found for this or any previous visit (from the past 24 hour(s)). No results found.  ED Clinical Impression  Dizziness  Atrial fibrillation, unspecified type (HCC)  Abnormal EKG   ED Assessment/Plan  EKG: Atrial fibrillation with RVR, left bundle branch block. T-wave inversion in V5, V6, 1 and aVL. No ST elevation. No previous EKG for comparison.   She is not orthostatic.  Patient's blood pressure is elevated, and she does have what is presumably new onset atrial fibrillation. Transferring to the ED via EMS because of the EKG changes. Patient is otherwise stable.    Discussed MDM, plan and followup with patient and family. They agree with plan.   No orders of the defined types were placed in this encounter.   *This clinic note was created using Dragon dictation software. Therefore, there may be occasional  mistakes despite careful proofreading.  ?   Melynda Ripple, MD 11/29/16 [place], MD 11/29/16 [phone]

## 2016-11-29 NOTE — ED Notes (Signed)
Report to chad

## 2016-11-29 NOTE — ED Notes (Signed)
Got patient hooked up to the monitior and did ekg shown to er doctor

## 2016-11-29 NOTE — ED Notes (Signed)
Notified [facility] 

## 2016-11-29 NOTE — ED Notes (Signed)
Patient was placed on a bedside monitor and 2 [facility] o2 at time that it was determined going to ed

## 2016-11-29 NOTE — ED Triage Notes (Signed)
r  Earache   With  Nausea     And  epigastic  Burning        dizzyness      As   Well      Symptoms      X   About  1   Week      Pt      Is    On  Anti  Biotics

## 2016-11-30 ENCOUNTER — Telehealth (HOSPITAL_COMMUNITY): Payer: Self-pay | Admitting: *Deleted

## 2016-11-30 NOTE — Telephone Encounter (Signed)
I cld pt to set appt through afib.  Pt does not speak [place]. I cld back using the language line and was told that the patient requests I contact her friend that took her to the ER Kaylee Levine at [phone] and gave permission for her to make appt for her.  I LMOM for Kaylee Levine to call back

## 2016-11-30 NOTE — Telephone Encounter (Signed)
Pt friend Jeani SowCarmen Lopez called back to let us know that she will check with pt and son to see if she even wants to sched with afib clinic. She stated that she was unsure about this because they [facility].  I advised that she can most certainly get her PCP to refer her to a [facility] cardiologist if that is what they would rather do, but reminded her of our number should they need to reach back out to us to sched.

## 2019-06-05 ENCOUNTER — Encounter (HOSPITAL_COMMUNITY): Payer: Self-pay | Admitting: Emergency Medicine

## 2019-06-05 ENCOUNTER — Emergency Department (HOSPITAL_COMMUNITY)
Admission: EM | Admit: 2019-06-05 | Discharge: 2019-06-06 | Disposition: A | Discharge status: Home / Self Care | Payer: Medicare Other | Attending: Emergency Medicine | Admitting: Emergency Medicine

## 2019-06-05 ENCOUNTER — Other Ambulatory Visit: Payer: Self-pay

## 2019-06-05 DIAGNOSIS — Z20828 Contact with and (suspected) exposure to other viral communicable diseases: Secondary | ICD-10-CM | POA: Insufficient documentation

## 2019-06-05 DIAGNOSIS — N390 Urinary tract infection, site not specified: Secondary | ICD-10-CM | POA: Insufficient documentation

## 2019-06-05 DIAGNOSIS — R42 Dizziness and giddiness: Secondary | ICD-10-CM | POA: Diagnosis not present

## 2019-06-05 LAB — BASIC METABOLIC PANEL
Anion gap: 12 (ref 5–15)
BUN: 12 mg/dL (ref 8–23)
CO2: 23 mmol/L (ref 22–32)
Calcium: 9.4 mg/dL (ref 8.9–10.3)
Chloride: 98 mmol/L (ref 98–111)
Creatinine, Ser: 0.87 mg/dL (ref 0.44–1.00)
GFR calc Af Amer: >60 mL/min (ref >60)
GFR calc non Af Amer: >60 mL/min (ref >60)
Glucose, Bld: 105 mg/dL — ABNORMAL HIGH (ref 70–99)
Potassium: 4 mmol/L (ref 3.5–5.1)
Sodium: 133 mmol/L — ABNORMAL LOW (ref 135–145)

## 2019-06-05 LAB — CBC
HCT: 34.7 % — ABNORMAL LOW (ref 36.0–46.0)
Hemoglobin: 11.5 g/dL — ABNORMAL LOW (ref 12.0–15.0)
MCH: 27.2 pg (ref 26.0–34.0)
MCHC: 33.1 g/dL (ref 30.0–36.0)
MCV: 82 fL (ref 80.0–100.0)
Platelets: 224 10*3/uL (ref 150–400)
RBC: 4.23 MIL/uL (ref 3.87–5.11)
RDW: 14 % (ref 11.5–15.5)
WBC: 7.1 10*3/uL (ref 4.0–10.5)
nRBC: 0 % (ref 0.0–0.2)

## 2019-06-05 LAB — URINALYSIS, ROUTINE W REFLEX MICROSCOPIC
Bilirubin Urine: NEGATIVE
Glucose, UA: NEGATIVE mg/dL
Ketones, ur: NEGATIVE mg/dL
Nitrite: NEGATIVE
Protein, ur: NEGATIVE mg/dL
Specific Gravity, Urine: 1.006 (ref 1.005–1.030)
WBC, UA: >50 WBC/hpf — ABNORMAL HIGH (ref 0–5)
pH: 7 (ref 5.0–8.0)

## 2019-06-05 MED ORDER — SODIUM CHLORIDE 0.9 % IV BOLUS
1000.0000 mL | Freq: Once | INTRAVENOUS | Status: AC
  Administered 2019-06-05: 1000 mL via INTRAVENOUS

## 2019-06-05 MED ORDER — SODIUM CHLORIDE 0.9 % IV SOLN
1.0000 g | Freq: Once | INTRAVENOUS | Status: AC
  Administered 2019-06-05: 1 g via INTRAVENOUS
  Filled 2019-06-05: qty 10

## 2019-06-05 MED ORDER — ONDANSETRON HCL 4 MG/2ML IJ SOLN
4.0000 mg | Freq: Once | INTRAMUSCULAR | Status: AC
  Administered 2019-06-05: 23:00:00 4 mg via INTRAVENOUS
  Filled 2019-06-05: qty 2

## 2019-06-05 MED ORDER — MECLIZINE HCL 25 MG PO TABS
25.0000 mg | ORAL_TABLET | Freq: Once | ORAL | Status: AC
  Administered 2019-06-05: 23:00:00 25 mg via ORAL
  Filled 2019-06-05: qty 1

## 2019-06-05 MED ORDER — SODIUM CHLORIDE 0.9% FLUSH: 3.0000 mL | Freq: Once | INTRAVENOUS | Status: DC

## 2019-06-05 NOTE — ED Provider Notes (Signed)
[facility] EMERGENCY DEPARTMENT Provider Note   CSN: [id number] Arrival date & time: 06/05/19  1615     History   Chief Complaint Chief Complaint  Patient presents with  . Dizziness    HPI Kaylee Levine is a 85 y.o. female.     Patient with past medical history notable for A. fib, diabetes, hypertension, and thyroid disease, presents to the emergency department with a chief complaint of dizziness.  She states she has had room spinning type dizziness for the past day or 2.  She reports a history of vertigo.  She has felt like this in the past, and has taken meclizine.  She has not tried any treatments for her symptoms today.  She also reports nausea and vomiting and dysuria.  She states that the symptoms have been ongoing for the past few days.  She denies any fevers, chills, cough, chest pain, or shortness of breath.  She called her doctor, and was advised to come to the emergency department.  The were concerned for coronavirus.  Patient denies any known exposures to coronavirus.  She denies any other associated symptoms, including vision changes, slurred speech, numbness, weakness, or tingling.  The history is provided by the patient. The history is limited by a language barrier. A language interpreter was used.    Past Medical History:  Diagnosis Date  . Diabetes mellitus   . Keratosis, seborrheic    mons pubis  . Osteopenia   . Thyroid disease   . Vitamin D deficiency     Patient Active Problem List   Diagnosis Date Noted  . Anxiety , Insomnia 03/25/2013  . Nausea alone 10/29/2012  . DJD (degenerative joint disease) 03/08/2012  . Diabetes mellitus ([place]) 02/02/2012  . Hypercholesterolemia 07/17/2011  . Hypothyroidism 07/17/2011  . Hypertension 07/17/2011  . Anemia 07/17/2011  . Osteopenia 07/17/2011    Past Surgical History:  Procedure Laterality Date  . APPENDECTOMY       OB History    Gravida  2   Para  2   Term  2   Preterm       AB      Living  2     SAB      TAB      Ectopic      Multiple      Live Births  2            Home Medications    Prior to Admission medications   Medication Sig Start Date End Date Taking? Authorizing Provider  acetaminophen (TYLENOL) 650 MG CR tablet Take 650 mg by mouth every 8 (eight) hours as needed for pain.    [provider]  alendronate (FOSAMAX) 70 MG tablet TAKE 1 TABLET BY MOUTH ONCE WEEKLY. TAKE WITH A FULL GLASS OF WATER ON EMPTY STOMACH. Patient not taking: Reported on 11/29/2016 03/25/13   Colon Branch, MD  ALPRAZolam Duanne Moron) 0.5 MG tablet Take 0.5-1 tablets (0.25-0.5 mg total) by mouth at bedtime as needed for sleep. Patient not taking: Reported on 11/29/2016 03/25/13   Colon Branch, MD  Blood Glucose Monitoring Suppl (ONE TOUCH ULTRA SYSTEM KIT) W/DEVICE KIT 1 kit by Does not apply route once. 06/03/12   Colon Branch, MD  ferrous sulfate 325 (65 FE) MG tablet Take 325 mg by mouth daily with breakfast.    [provider]  glucose blood (ONE TOUCH TEST STRIPS) test strip Use as instructed 06/07/12   Colon Branch, MD  HYDROcodone-acetaminophen (VICODIN) 5-500 MG per tablet take 1 tablet by mouth twice a day if needed for pain Patient not taking: Reported on 11/29/2016 05/15/12   Colon Branch, MD  levothyroxine Wilmer Floor, LEVOTHROID) 25 MCG tablet take 1 tablet by mouth once daily 05/15/13   Colon Branch, MD  lisinopril-hydrochlorothiazide (PRINZIDE,ZESTORETIC) 10-12.5 MG per tablet take 1 tablet by mouth every morning 03/25/13   Colon Branch, MD  meclizine (ANTIVERT) 25 MG tablet Take 1 tablet (25 mg total) by mouth 3 (three) times daily as needed for dizziness. 11/29/16   Julianne Rice, MD  metFORMIN (GLUCOPHAGE) 500 MG tablet take 1 tablet by mouth daily with BREAKFAST 03/25/13   Colon Branch, MD  MOVIPREP 100 G SOLR Take 1 kit (100 g total) by mouth once. Patient not taking: Reported on 11/29/2016 12/06/12   Milus Banister, MD  omeprazole (PRILOSEC) 20  MG capsule take 1 capsule by mouth once daily 11/29/16   Julianne Rice, MD  simvastatin (ZOCOR) 40 MG tablet Take 1 tablet (40 mg total) by mouth at bedtime. 03/25/13 11/29/16  Colon Branch, MD    Family History Family History  Problem Relation Age of Onset  . Diabetes Mother   . Hypertension Mother   . Heart disease Mother     Social History Social History   Tobacco Use  . Smoking status: Never Smoker  . Smokeless tobacco: Never Used  Substance Use Topics  . Alcohol use: No  . Drug use: No     Allergies   Patient has no known allergies.   Review of Systems Review of Systems  All other systems reviewed and are negative.    Physical Exam Updated Vital Signs BP (!) 141/68   Pulse 82   Temp 98.6 F (37 C) (Oral)   Resp 16   SpO2 98%   Physical Exam Vitals signs and nursing note reviewed.  Constitutional:      General: She is not in acute distress.    Appearance: She is well-developed.  HENT:     Head: Normocephalic and atraumatic.     Ears:     Comments: Some congestion behind right TM, left is clear, no erythema bilaterally Eyes:     Conjunctiva/sclera: Conjunctivae normal.     Comments: Mild horizontal nystagmus  Neck:     Musculoskeletal: Neck supple.  Cardiovascular:     Rate and Rhythm: Normal rate and regular rhythm.     Heart sounds: No murmur.  Pulmonary:     Effort: Pulmonary effort is normal. No respiratory distress.     Breath sounds: Normal breath sounds.  Abdominal:     Palpations: Abdomen is soft.     Tenderness: There is no abdominal tenderness.  Musculoskeletal: Normal range of motion.  Skin:    General: Skin is warm and dry.  Neurological:     General: No focal deficit present.     Mental Status: She is alert and oriented to person, place, and time.     Comments: CN 3-12 intact, speech is clear, no pronator drift, normal finger to nose, normal heel to shin  Psychiatric:        Mood and Affect: Mood normal.        Behavior:  Behavior normal.      ED Treatments / Results  Labs (all labs ordered are listed, but only abnormal results are displayed) Labs Reviewed  BASIC METABOLIC PANEL - Abnormal; Notable for the following components:  Result Value   Sodium 133 (*)    Glucose, Bld 105 (*)    All other components within normal limits  CBC - Abnormal; Notable for the following components:   Hemoglobin 11.5 (*)    HCT 34.7 (*)    All other components within normal limits  URINALYSIS, ROUTINE W REFLEX MICROSCOPIC - Abnormal; Notable for the following components:   Color, Urine STRAW (*)    Hgb urine dipstick SMALL (*)    Leukocytes,Ua LARGE (*)    WBC, UA >50 (*)    Bacteria, UA RARE (*)    All other components within normal limits  CBG MONITORING, ED    EKG EKG Interpretation  Date/Time:  Thursday June 05 2019 16:51:38 EDT Ventricular Rate:  88 PR Interval:  142 QRS Duration: 112 QT Interval:  404 QTC Calculation: 488 R Axis:   103 Text Interpretation:  Normal sinus rhythm Rightward axis ST & T wave abnormality, consider inferolateral ischemia Prolonged QT Abnormal ECG No significant change since last tracing Confirmed by Merrily Pew [phone]) on 06/05/2019 10:53:04 PM   Radiology No results found.  Procedures Procedures (including critical care time)  Medications Ordered in ED Medications  sodium chloride flush (NS) 0.9 % injection 3 mL (has no administration in time range)  meclizine (ANTIVERT) tablet 25 mg (has no administration in time range)  ondansetron (ZOFRAN) injection 4 mg (has no administration in time range)  cefTRIAXone (ROCEPHIN) 1 g in sodium chloride 0.9 % 100 mL IVPB (has no administration in time range)  sodium chloride 0.9 % bolus 1,000 mL (has no administration in time range)     Initial Impression / Assessment and Plan / ED Course  I have reviewed the triage vital signs and the nursing notes.  Pertinent labs & imaging results that were available during my  care of the patient were reviewed by me and considered in my medical decision making (see chart for details).        Patient here with vertiginous type dizziness that start 2 days ago.  She has had vertigo before, and was treated successfully with meclizine.  She has some pain in her right ear along with some congestion.  This could be triggering the vertigo.  The dizziness is worsened with position changes and head movement.  There is mild horizontal nystagmus.  I am most suspicious of peripheral etiology.  I doubt central origin.  She has also had some n/v/d.  Her urine is suspicious for UTI.  She does report associated dysuria.  Will give fluids and antibiotics.  Will also treat with meclizine.  Outpatient COVID test pending.  Case discussed with Dr. Dayna Barker, who agrees with plan.  If patient feels improved with treatment can go home with outpatient follow-up.  1:05 AM Rechecked.  Ambulates well without dizziness.  Feels significantly improved.  Desires to go home.    DC with antibiotics and meclizine.  Return precautions given.  Final Clinical Impressions(s) / ED Diagnoses   Final diagnoses:  Dizziness  Vertigo  Urinary tract infection without hematuria, site unspecified    ED Discharge Orders         Ordered    cephALEXin (KEFLEX) 500 MG capsule  2 times daily     06/06/19 0104    meclizine (ANTIVERT) 25 MG tablet  2 times daily PRN     06/06/19 0104    ondansetron (ZOFRAN ODT) 4 MG disintegrating tablet  Every 8 hours PRN     06/06/19 0104  Montine Circle, PA-C 06/06/19 0106    Mesner, Corene Cornea, MD 06/07/19 [phone]

## 2019-06-05 NOTE — ED Triage Notes (Signed)
Pt  Has been having dizzy feeling since yesterday and ringing in her right ear. Pt has had n/v/d yesterday. No diarrhea today. Pt feels like the room is spinning. Denies CP/SOB

## 2019-06-06 LAB — SARS CORONAVIRUS 2 (TAT 6-24 HRS): SARS Coronavirus 2: NEGATIVE

## 2019-06-06 MED ORDER — CEPHALEXIN 500 MG PO CAPS: 500.0000 mg | ORAL_CAPSULE | Freq: Two times a day (BID) | ORAL | 0 refills | Status: AC

## 2019-06-06 MED ORDER — MECLIZINE HCL 25 MG PO TABS
25.0000 mg | ORAL_TABLET | Freq: Two times a day (BID) | ORAL | 0 refills | Status: AC | PRN
Start: 2019-06-06 — End: ?

## 2019-06-06 MED ORDER — ONDANSETRON 4 MG PO TBDP: 4.0000 mg | ORAL_TABLET | Freq: Three times a day (TID) | ORAL | 0 refills | Status: AC | PRN

## 2019-06-06 NOTE — ED Notes (Addendum)
Pt was able to ambulate to the bathroom and back with stand by assist.
